# Patient Record
Sex: Female | Born: 1950 | Race: Black or African American | Hispanic: No | Marital: Single | Smoking: Former smoker
Health system: Southern US, Community
[De-identification: ages and names within clinical notes are randomized; demographics above are authoritative.]

## PROBLEM LIST (undated history)

## (undated) DIAGNOSIS — M069 Rheumatoid arthritis, unspecified: Secondary | ICD-10-CM

## (undated) DIAGNOSIS — H409 Unspecified glaucoma: Secondary | ICD-10-CM

## (undated) DIAGNOSIS — J45909 Unspecified asthma, uncomplicated: Secondary | ICD-10-CM

## (undated) DIAGNOSIS — I10 Essential (primary) hypertension: Secondary | ICD-10-CM

## (undated) DIAGNOSIS — F32A Depression, unspecified: Secondary | ICD-10-CM

## (undated) DIAGNOSIS — G35 Multiple sclerosis: Secondary | ICD-10-CM

## (undated) DIAGNOSIS — B1920 Unspecified viral hepatitis C without hepatic coma: Secondary | ICD-10-CM

## (undated) DIAGNOSIS — F329 Major depressive disorder, single episode, unspecified: Secondary | ICD-10-CM

## (undated) DIAGNOSIS — K219 Gastro-esophageal reflux disease without esophagitis: Secondary | ICD-10-CM

## (undated) HISTORY — PX: CHOLECYSTECTOMY: SHX55

## (undated) HISTORY — DX: Gastro-esophageal reflux disease without esophagitis: K21.9

## (undated) HISTORY — DX: Unspecified asthma, uncomplicated: J45.909

## (undated) HISTORY — DX: Rheumatoid arthritis, unspecified: M06.9

## (undated) HISTORY — DX: Unspecified glaucoma: H40.9

---

## 1998-09-21 ENCOUNTER — Emergency Department (HOSPITAL_COMMUNITY): Admission: EM | Admit: 1998-09-21 | Discharge: 1998-09-21 | Payer: Self-pay | Admitting: Emergency Medicine

## 1998-12-13 ENCOUNTER — Emergency Department (HOSPITAL_COMMUNITY): Admission: EM | Admit: 1998-12-13 | Discharge: 1998-12-13 | Payer: Self-pay | Admitting: Emergency Medicine

## 1999-01-19 ENCOUNTER — Emergency Department (HOSPITAL_COMMUNITY): Admission: EM | Admit: 1999-01-19 | Discharge: 1999-01-19 | Payer: Self-pay | Admitting: *Deleted

## 1999-03-13 ENCOUNTER — Emergency Department (HOSPITAL_COMMUNITY): Admission: EM | Admit: 1999-03-13 | Discharge: 1999-03-13 | Payer: Self-pay | Admitting: Emergency Medicine

## 1999-03-20 ENCOUNTER — Encounter: Admission: RE | Admit: 1999-03-20 | Discharge: 1999-03-20 | Payer: Self-pay | Admitting: Internal Medicine

## 1999-09-04 ENCOUNTER — Emergency Department (HOSPITAL_COMMUNITY): Admission: EM | Admit: 1999-09-04 | Discharge: 1999-09-04 | Payer: Self-pay | Admitting: Emergency Medicine

## 1999-09-29 ENCOUNTER — Emergency Department (HOSPITAL_COMMUNITY): Admission: EM | Admit: 1999-09-29 | Discharge: 1999-09-29 | Payer: Self-pay | Admitting: Emergency Medicine

## 1999-09-29 ENCOUNTER — Encounter: Payer: Self-pay | Admitting: Emergency Medicine

## 1999-10-22 ENCOUNTER — Emergency Department (HOSPITAL_COMMUNITY): Admission: EM | Admit: 1999-10-22 | Discharge: 1999-10-23 | Payer: Self-pay | Admitting: Internal Medicine

## 1999-10-23 ENCOUNTER — Encounter: Payer: Self-pay | Admitting: Emergency Medicine

## 1999-10-25 ENCOUNTER — Emergency Department (HOSPITAL_COMMUNITY): Admission: EM | Admit: 1999-10-25 | Discharge: 1999-10-25 | Payer: Self-pay | Admitting: Emergency Medicine

## 1999-11-14 ENCOUNTER — Encounter: Payer: Self-pay | Admitting: Emergency Medicine

## 1999-11-14 ENCOUNTER — Emergency Department (HOSPITAL_COMMUNITY): Admission: EM | Admit: 1999-11-14 | Discharge: 1999-11-14 | Payer: Self-pay | Admitting: Emergency Medicine

## 1999-11-17 ENCOUNTER — Emergency Department (HOSPITAL_COMMUNITY): Admission: EM | Admit: 1999-11-17 | Discharge: 1999-11-17 | Payer: Self-pay | Admitting: Emergency Medicine

## 1999-12-03 ENCOUNTER — Emergency Department (HOSPITAL_COMMUNITY): Admission: EM | Admit: 1999-12-03 | Discharge: 1999-12-03 | Payer: Self-pay | Admitting: Emergency Medicine

## 1999-12-10 ENCOUNTER — Emergency Department (HOSPITAL_COMMUNITY): Admission: EM | Admit: 1999-12-10 | Discharge: 1999-12-10 | Payer: Self-pay | Admitting: Emergency Medicine

## 1999-12-11 ENCOUNTER — Encounter: Payer: Self-pay | Admitting: Emergency Medicine

## 2000-04-08 ENCOUNTER — Emergency Department (HOSPITAL_COMMUNITY): Admission: EM | Admit: 2000-04-08 | Discharge: 2000-04-08 | Payer: Self-pay | Admitting: Emergency Medicine

## 2000-04-08 ENCOUNTER — Encounter: Payer: Self-pay | Admitting: Family Medicine

## 2002-02-06 ENCOUNTER — Emergency Department (HOSPITAL_COMMUNITY): Admission: EM | Admit: 2002-02-06 | Discharge: 2002-02-06 | Payer: Self-pay | Admitting: Emergency Medicine

## 2002-05-10 ENCOUNTER — Encounter: Payer: Self-pay | Admitting: Emergency Medicine

## 2002-05-10 ENCOUNTER — Emergency Department (HOSPITAL_COMMUNITY): Admission: EM | Admit: 2002-05-10 | Discharge: 2002-05-10 | Payer: Self-pay | Admitting: Emergency Medicine

## 2003-03-16 ENCOUNTER — Inpatient Hospital Stay (HOSPITAL_COMMUNITY): Admission: AD | Admit: 2003-03-16 | Discharge: 2003-03-17 | Payer: Self-pay | Admitting: Emergency Medicine

## 2003-03-16 ENCOUNTER — Encounter: Payer: Self-pay | Admitting: Cardiology

## 2003-03-17 ENCOUNTER — Encounter: Payer: Self-pay | Admitting: Cardiology

## 2005-08-22 ENCOUNTER — Emergency Department (HOSPITAL_COMMUNITY): Admission: EM | Admit: 2005-08-22 | Discharge: 2005-08-22 | Payer: Self-pay | Admitting: Family Medicine

## 2006-01-19 ENCOUNTER — Ambulatory Visit: Payer: Self-pay | Admitting: Internal Medicine

## 2006-01-20 ENCOUNTER — Ambulatory Visit: Payer: Self-pay | Admitting: Gastroenterology

## 2006-01-22 ENCOUNTER — Ambulatory Visit: Payer: Self-pay | Admitting: Internal Medicine

## 2006-03-13 ENCOUNTER — Emergency Department (HOSPITAL_COMMUNITY): Admission: EM | Admit: 2006-03-13 | Discharge: 2006-03-13 | Payer: Self-pay | Admitting: Emergency Medicine

## 2006-04-19 ENCOUNTER — Ambulatory Visit (HOSPITAL_COMMUNITY): Admission: RE | Admit: 2006-04-19 | Discharge: 2006-04-20 | Payer: Self-pay | Admitting: Surgery

## 2007-05-16 ENCOUNTER — Emergency Department (HOSPITAL_COMMUNITY): Admission: EM | Admit: 2007-05-16 | Discharge: 2007-05-16 | Payer: Self-pay | Admitting: Emergency Medicine

## 2007-06-08 ENCOUNTER — Emergency Department (HOSPITAL_COMMUNITY): Admission: EM | Admit: 2007-06-08 | Discharge: 2007-06-08 | Payer: Self-pay | Admitting: Emergency Medicine

## 2007-07-01 ENCOUNTER — Ambulatory Visit: Payer: Self-pay | Admitting: Internal Medicine

## 2007-10-19 ENCOUNTER — Emergency Department (HOSPITAL_COMMUNITY): Admission: EM | Admit: 2007-10-19 | Discharge: 2007-10-19 | Payer: Self-pay | Admitting: Emergency Medicine

## 2009-05-22 ENCOUNTER — Ambulatory Visit: Payer: Self-pay | Admitting: Internal Medicine

## 2009-05-28 ENCOUNTER — Ambulatory Visit: Payer: Self-pay | Admitting: Internal Medicine

## 2009-05-28 LAB — CONVERTED CEMR LAB: HCV Genotype: 2

## 2009-05-30 ENCOUNTER — Ambulatory Visit: Payer: Self-pay | Admitting: Internal Medicine

## 2009-06-13 ENCOUNTER — Ambulatory Visit: Payer: Self-pay | Admitting: Internal Medicine

## 2009-07-01 ENCOUNTER — Ambulatory Visit: Payer: Self-pay | Admitting: Gastroenterology

## 2009-07-01 DIAGNOSIS — B182 Chronic viral hepatitis C: Secondary | ICD-10-CM | POA: Insufficient documentation

## 2009-07-01 DIAGNOSIS — R1310 Dysphagia, unspecified: Secondary | ICD-10-CM | POA: Insufficient documentation

## 2009-07-01 DIAGNOSIS — G35 Multiple sclerosis: Secondary | ICD-10-CM | POA: Insufficient documentation

## 2009-07-01 DIAGNOSIS — K219 Gastro-esophageal reflux disease without esophagitis: Secondary | ICD-10-CM

## 2009-07-01 LAB — CONVERTED CEMR LAB
ALT: 66 units/L — ABNORMAL HIGH (ref 0–35)
Albumin: 3.5 g/dL (ref 3.5–5.2)
Total Protein: 8.6 g/dL — ABNORMAL HIGH (ref 6.0–8.3)
aPTT: 27.2 s (ref 21.7–28.8)

## 2009-07-02 ENCOUNTER — Ambulatory Visit: Payer: Self-pay | Admitting: Gastroenterology

## 2009-07-03 ENCOUNTER — Ambulatory Visit (HOSPITAL_COMMUNITY): Admission: RE | Admit: 2009-07-03 | Discharge: 2009-07-03 | Payer: Self-pay | Admitting: Gastroenterology

## 2009-07-08 ENCOUNTER — Telehealth: Payer: Self-pay | Admitting: Gastroenterology

## 2009-07-09 ENCOUNTER — Telehealth: Payer: Self-pay | Admitting: Gastroenterology

## 2009-07-09 ENCOUNTER — Ambulatory Visit: Payer: Self-pay | Admitting: Gastroenterology

## 2009-07-11 ENCOUNTER — Ambulatory Visit: Payer: Self-pay | Admitting: Gastroenterology

## 2009-07-11 ENCOUNTER — Encounter: Payer: Self-pay | Admitting: Gastroenterology

## 2009-07-15 ENCOUNTER — Telehealth: Payer: Self-pay | Admitting: Gastroenterology

## 2009-08-06 ENCOUNTER — Encounter: Payer: Self-pay | Admitting: Gastroenterology

## 2009-08-29 ENCOUNTER — Ambulatory Visit: Payer: Self-pay | Admitting: Gastroenterology

## 2009-09-26 ENCOUNTER — Ambulatory Visit: Payer: Self-pay | Admitting: Gastroenterology

## 2009-09-26 ENCOUNTER — Encounter: Payer: Self-pay | Admitting: Gastroenterology

## 2009-10-11 ENCOUNTER — Ambulatory Visit: Payer: Self-pay | Admitting: Internal Medicine

## 2009-10-11 ENCOUNTER — Inpatient Hospital Stay (HOSPITAL_COMMUNITY): Admission: EM | Admit: 2009-10-11 | Discharge: 2009-10-14 | Payer: Self-pay | Admitting: Emergency Medicine

## 2009-10-11 ENCOUNTER — Encounter: Payer: Self-pay | Admitting: Internal Medicine

## 2009-10-17 ENCOUNTER — Ambulatory Visit: Payer: Self-pay | Admitting: Gastroenterology

## 2009-12-16 ENCOUNTER — Ambulatory Visit: Payer: Self-pay | Admitting: Internal Medicine

## 2009-12-16 ENCOUNTER — Encounter (INDEPENDENT_AMBULATORY_CARE_PROVIDER_SITE_OTHER): Payer: Self-pay | Admitting: Adult Health

## 2009-12-16 LAB — CONVERTED CEMR LAB
BUN: 12 mg/dL (ref 6–23)
CO2: 26 meq/L (ref 19–32)
Creatinine, Ser: 0.92 mg/dL (ref 0.40–1.20)
HDL: 55 mg/dL (ref >39)
LDL Cholesterol: 135 mg/dL — ABNORMAL HIGH (ref 0–99)
Potassium: 4.2 meq/L (ref 3.5–5.3)
Sodium: 138 meq/L (ref 135–145)
Triglycerides: 99 mg/dL (ref <150)

## 2009-12-28 ENCOUNTER — Emergency Department (HOSPITAL_COMMUNITY): Admission: EM | Admit: 2009-12-28 | Discharge: 2009-12-28 | Payer: Self-pay | Admitting: Family Medicine

## 2010-01-07 ENCOUNTER — Emergency Department (HOSPITAL_COMMUNITY): Admission: EM | Admit: 2010-01-07 | Discharge: 2010-01-07 | Payer: Self-pay | Admitting: Emergency Medicine

## 2010-03-06 ENCOUNTER — Emergency Department (HOSPITAL_COMMUNITY)
Admission: EM | Admit: 2010-03-06 | Discharge: 2010-03-06 | Payer: Self-pay | Source: Home / Self Care | Admitting: Emergency Medicine

## 2010-04-25 ENCOUNTER — Encounter: Admission: RE | Admit: 2010-04-25 | Discharge: 2010-04-25 | Payer: Self-pay | Admitting: Orthopaedic Surgery

## 2010-06-14 ENCOUNTER — Emergency Department (HOSPITAL_COMMUNITY)
Admission: EM | Admit: 2010-06-14 | Discharge: 2010-06-14 | Payer: Self-pay | Source: Home / Self Care | Admitting: Emergency Medicine

## 2010-06-14 ENCOUNTER — Encounter: Payer: Self-pay | Admitting: Orthopaedic Surgery

## 2010-06-15 ENCOUNTER — Encounter: Payer: Self-pay | Admitting: Gastroenterology

## 2010-06-17 LAB — COMPREHENSIVE METABOLIC PANEL
AST: 58 U/L — ABNORMAL HIGH (ref 0–37)
Albumin: 3.3 g/dL — ABNORMAL LOW (ref 3.5–5.2)
Alkaline Phosphatase: 80 U/L (ref 39–117)
BUN: 10 mg/dL (ref 6–23)
Creatinine, Ser: 0.84 mg/dL (ref 0.4–1.2)
GFR calc Af Amer: >60 mL/min (ref >60)
Sodium: 140 mEq/L (ref 135–145)
Total Protein: 8.7 g/dL — ABNORMAL HIGH (ref 6.0–8.3)

## 2010-06-17 LAB — DIFFERENTIAL
Basophils Relative: 0 % (ref 0–1)
Eosinophils Relative: 0 % (ref 0–5)
Monocytes Absolute: 0.6 10*3/uL (ref 0.1–1.0)
Monocytes Relative: 6 % (ref 3–12)
Neutro Abs: 8.8 10*3/uL — ABNORMAL HIGH (ref 1.7–7.7)
Neutrophils Relative %: 81 % — ABNORMAL HIGH (ref 43–77)

## 2010-06-17 LAB — URINE MICROSCOPIC-ADD ON

## 2010-06-17 LAB — URINALYSIS, ROUTINE W REFLEX MICROSCOPIC
Hgb urine dipstick: NEGATIVE
Nitrite: NEGATIVE
Protein, ur: NEGATIVE mg/dL
Urine Glucose, Fasting: NEGATIVE mg/dL
Urobilinogen, UA: 0.2 mg/dL (ref 0.0–1.0)
pH: 6 (ref 5.0–8.0)

## 2010-06-17 LAB — CBC
HCT: 39.9 % (ref 36.0–46.0)
Hemoglobin: 13.2 g/dL (ref 12.0–15.0)
MCHC: 33.1 g/dL (ref 30.0–36.0)
RBC: 4.66 MIL/uL (ref 3.87–5.11)
WBC: 10.9 10*3/uL — ABNORMAL HIGH (ref 4.0–10.5)

## 2010-06-24 NOTE — Letter (Signed)
Summary: Results Letter  [facility]  [address] [place], [place] [address]   Phone: [phone]  Fax: [phone]        July 01, 2009 MRN: [id number]    [facility] [address] [place], [place]  [address]    Dear Ms. MCCRAY,  It is my pleasure to have treated you recently as a new patient in my office. I appreciate your confidence and the opportunity to participate in your care.  Since I do have a busy inpatient endoscopy schedule and office schedule, my office hours vary weekly. I am, however, available for emergency calls everyday through my office. If I am not available for an urgent office appointment, another one of our gastroenterologist will be able to assist you.  My well-trained staff are prepared to help you at all times. For emergencies after office hours, a physician from our Gastroenterology section is always available through my 24 hour answering service  Once again I welcome you as a new patient and I look forward to a happy and healthy relationship             Sincerely,  Louis Meckel MD  This letter has been electronically signed by your physician.

## 2010-06-24 NOTE — Procedures (Signed)
Summary: Upper Endoscopy w/DIL  Patient: Deborah Reid Note: All result statuses are Final unless otherwise noted.  Tests: (1) Upper Endoscopy w/DIL (UED)  UED Upper Endoscopy w/DIL                             DONE     [facility]     [address]     [place], [place]  [address]           ENDOSCOPY PROCEDURE REPORT           PATIENT:  Deborah Reid, Deborah Reid  MR#:  [id number]     BIRTHDATE:  [birth year], 59 yrs. old  GENDER:  female           ENDOSCOPIST:  Barbette Hair. Arlyce Dice, MD     Referred by:           PROCEDURE DATE:  07/02/2009     PROCEDURE:  EGD, diagnostic, Maloney Dilation of Esophagus     ASA CLASS:  Class II     INDICATIONS:  dysphagia           MEDICATIONS:   Fentanyl 50 mcg IV, Versed 5 mg IV, glycopyrrolate     (Robinal) 0.2 mg IV, 0.6cc simethancone 0.6 cc PO     TOPICAL ANESTHETIC:  Exactacain Spray           DESCRIPTION OF PROCEDURE:   After the risks benefits and     alternatives of the procedure were thoroughly explained, informed     consent was obtained.  The LB GIF-H180 [id number] endoscope was     introduced through the mouth and advanced to the third portion of     the duodenum, without limitations.  The instrument was slowly     withdrawn as the mucosa was fully examined.     <<PROCEDUREIMAGES>>           A stricture was found at the gastroesophageal junction (see     image1). Early esophageal stricture Dilation with maloney dilator     18mm Moderate resistance; no heme  Otherwise the examination was     normal.    Retroflexed views revealed no abnormalities.    The     scope was then withdrawn from the patient and the procedure     completed.           COMPLICATIONS:  None           ENDOSCOPIC IMPRESSION:     1) Stricture at the gastroesophageal junction - s/p dilitation     2) Otherwise normal examination     RECOMMENDATIONS:     1) continue PPI     2) Call office next 2-3 days to schedule an office appointment     for 4-6 weeks     3)  dilatations PRN           REPEAT EXAM:  No           ______________________________     Barbette Hair. Arlyce Dice, MD           cc Dr. Donia Guiles           n.     eSIGNED:   Barbette Hair. Kaplan at 07/02/2009 04:41 PM           Cheri Kearns, [phone]  Note: An exclamation mark (!) indicates a result that was not dispersed into the flowsheet. Document Creation Date: 07/03/2009 10:18 AM  _______________________________________________________________________  (1) Order result status: Final Collection or observation date-time: 07/02/2009 16:37 Requested date-time:  Receipt date-time:  Reported date-time:  Referring Physician:   Ordering Physician: Melvia Heaps [phone]) Specimen Source:  Source: Launa Grill Order Number: [id number] Lab site:

## 2010-06-24 NOTE — Progress Notes (Signed)
Summary: TRIAGE  Phone Note From Other Clinic Call back at [phone]   Caller: Admitting Lib Call For: Arlyce Dice Summary of Call: Patient is preped for colon and she is not on there list,Lib checked with [facility] and she is not on there list either wants to speak to a nurse asap Initial call taken by: Tawni Levy,  July 08, 2009 12:46 PM  Follow-up for Phone Call        Pt. is scheduled for a Colonoscopy in [facility] on 07-09-09. Her instructions given to her on 07-01-09 have the correct date/time, she must have read them incorrectly. I spoke pt, she will  stay on clear liquids and come for Colonoscopy tomorrow as scheduled. Pt. instructed to call back as needed.  Follow-up by: Laureen Ochs LPN,  July 08, 2009 1:32 PM

## 2010-06-24 NOTE — Miscellaneous (Signed)
Summary: Hospital Admission  INTERNAL MEDICINE ADMISSION HISTORY AND PHYSICAL PCP: Dr. Reche Dixon ([facility]) CC: "paralyzed"  HPI: 60 y/o AA woman presents with a chief complaint of AMS and generalized weakness with a sudden onset on the morning of the admission. History is collected from the daughter. The patient was last seen normal at 10:23 am. She went to take a shower and called the daughter upstairs. When she got there, patient was not talking, was moving slowly and had slow and unsteady gate. Daguhter helped her to the room where the patient "fainted" on her bed for several minutes. She ran to drop off her kids at her neighbors. When she returned to her mother -the patient was unverbal, did not follow the commands. Daughter states that the patient's eye were rolling, her lips were twitching. the aptient was taken to the ED. Per daughter, the patient did not c/o HA, chest pain, SOB. she denies any similar Sx in the past. she denies any use of illicit drugs or ETOH use. patient has a Hx of MS diagnosed 3 years ago and currently on "one shot a week" prescribed by Dr. Benson Norway from [facility]. the daughter also states the patient had one episode of vision loss 2 years ago.  ALLERGIES: NKDA   PAST MEDICAL HISTORY:  Arthritis Asthma Depression Hepatitis C (07/2009) MS (2009) --seen by Dr. Benson Norway at [facility]. Cholelithiasis Esophageal stricture GERD   MEDICATIONS: SINGULAIR 10 MG TABS (MONTELUKAST SODIUM) as needed NORTRIPTYLINE HCL 10 MG CAPS (NORTRIPTYLINE HCL) once daily IBUPROFEN 800 MG TABS (IBUPROFEN) as needed OMEPRAZOLE 20 MG CPDR (OMEPRAZOLE) takes one tab daily   SOCIAL HISTORY: Occupation: Unemployed Patient is a former smoker. Has 3 grown daughters. Alcohol Use - no Daily Caffeine Use Illicit Drug Use - no   FAMILY HISTORY Family History of Diabetes: GF Family History of Heart Disease: GM Family History of Colon Cancer:Brother   ROS:per HPI VITALS: T:98.3  P: 102>78  BP: 155/78 R:17  O2SAT: 100% ON:RA PHYSICAL EXAM: General:  alert, well-developed, and cooperative to examination.   Head:  normocephalic and atraumatic.   Eyes:  vision grossly intact, pupils equal, pupils round, pupils reactive to light, no injection and anicteric.   Mouth:  pharynx pink and moist, no erythema, and no exudates.   Neck:  supple, full ROM, no thyromegaly, no JVD, and no carotid bruits.   Lungs:  normal respiratory effort, no accessory muscle use, normal breath sounds, no crackles, and no wheezes.  CV: RRR, no M, S3, S4, no lifts or rubs. Abdomen: ND; BS+, NTTP, no HSM MSK: FROM of all extremities proximately and distally bialterally; no joint erythema, effusion or increased warmth to touch bilaterally.  Neurologic:  alert & oriented X3, cranial nerves II-XII intact, strength normal in all extremities, sensation intact to light touch, and gait normal.   Skin:  turgor normal and no rashes.   Psych:  Oriented X3, memory intact for recent and remote, normally interactive, good eye contact, not anxious appearing, and not depressed appearing.  LABS: Color, Urine                             YELLOW            YELLOW  Appearance                               CLEAR  CLEAR  Specific Gravity                         1.025             1.005-1.030  pH                                       6.0               5.0-8.0  Urine Glucose                            NEGATIVE          NEG              mg/dL  Bilirubin                                NEGATIVE          NEG  Ketones                                  15         a      NEG              mg/dL  Blood                                    NEGATIVE          NEG  Protein                                  NEGATIVE          NEG              mg/dL  Urobilinogen                             1.0               0.0-1.0          mg/dL  Nitrite                                  NEGATIVE          NEG  Leukocytes                                NEGATIVE          NEG  CKMB, POC                                1.4               1.0-8.0          ng/mL  Troponin I, POC                          <  0.05             0.00-0.09        ng/mL  Myoglobin, POC                           110               12-200           ng/mL Bilirubin, Total                         0.7               0.3-1.2          mg/dL  Bilirubin, Direct                        0.2               0.0-0.3          mg/dL  Indirect Bilirubin                       0.5               0.3-0.9          mg/dL  Alkaline Phosphatase                     81                39-117           U/L  SGOT (AST)                               55         h      0-37             U/L  SGPT (ALT)                               50         h      0-35             U/L  Total  Protein                           7.8               6.0-8.3          g/dL  Albumin-Blood                            3.3        l      3.5-5.2          g/dL  Sodium (NA)                              137               135-145          mEq/L  Potassium (K)  3.3        l      3.5-5.1          mEq/L  Chloride                                 107               96-112           mEq/L  CO2                                      23                19-32            mEq/L  Glucose                                  108        h      70-99            mg/dL  BUN                                      9                 6-23             mg/dL  Creatinine                               1.00              0.4-1.2          mg/dL  GFR, Est Non African American            57         l      >60              mL/min  GFR, Est African American                >60               >60              mL/min    Oversized comment, see footnote  1  Calcium                                  8.4               8.4-10.5         mg/dL  WBC                                      2.4        l      4.0-10.5         K/uL  RBC  3.99              3.87-5.11        MIL/uL  Hemoglobin (HGB)                         11.5       l      12.0-15.0        g/dL  Hematocrit (HCT)                         34.6       l      36.0-46.0        %  MCV                                      86.7              78.0-100.0       fL  MCHC                                     33.3              30.0-36.0        g/dL  RDW                                      15.3              11.5-15.5        %  Platelet Count (PLT)                     127        l      150-400          K/uL  Neutrophils, %                           55                43-77            %  Lymphocytes, %                           29                12-46            %  Monocytes, %                             16         h      3-12             %  Eosinophils, %                           0                 0-5              %  Basophils, %  1                 0-1              %  Neutrophils, Absolute                    1.3        l      1.7-7.7          K/uL  Lymphocytes, Absolute                    0.7               0.7-4.0          K/uL  Monocytes, Absolute                      0.4               0.1-1.0          K/uL  Eosinophils, Absolute                    0.0               0.0-0.7          K/uL  Basophils, Absolute                      0.0               0.0-0.1          K/uL    PTT(a-Partial Thromboplastn Time)        27                24-37          Protime ( Prothrombin Time)              13.3              11.6-15.2        seconds  INR                                      1.02              0.00-1.49   ASSESSMENT AND PLAN: (1)General muscle weakness. Unclear etiology. Working diagnoses are:   A. Per Dr. Thad Ranger (Neuro) consult -->likely psychogenic etiology. However, given Hx of MS --will order MRI of brain with contrast; and will request medical records from Dr. Benson Norway at [facility].  B. Seizure disorder is also probable given the HPI per the  patient's daughter. Will order prolactin level. C. Although postassium is slightly decreased; there is no other obvious electrolytes abnormality.  Endocrine disease such as hypothyroidism, Addison's disease or hyperaldosteronism are also need to be considered. Will order TSH. D. CVA and Catatonic syndrome due to the CNS casuses (such as viral encephalitis, intracranial hemorrhage) are not likely given normal CT of head and no focal deficits on neuro exam. TIA is also to be considered. However, further imaging with an MRI/MRA might help to differentiate this further. I. Infectious etiology. Will also check for HIV, RPR. Meningitis is low on the list of Dx --given, no meningismus, normal WBC count and lack of fever. J. Anemia as an etiology is unlikely given normal Hgb; Will keep NPO and request speech and swallow  eval first. Seizure watch. Will check Vit B12, Folate levels. (2) Elevated BP. No known established Dx of HTN. Will continue to monitor. (3) Hx of asthma. No wheezing, cough or dypnea. No treatment for now. (4)DEPRESSION/ANXIETY: Will hold Elavil and Prozac until cleared by peech and swallow evaluation. (5)VTE PROPH:Heparin.

## 2010-06-24 NOTE — Letter (Signed)
Summary: [facility]  [facility]   Imported By: Lester Door 10/23/2009 10:10:26  _____________________________________________________________________  External Attachment:    Type:   Image     Comment:   External Document

## 2010-06-24 NOTE — Letter (Signed)
Summary: Pretreatment Hep C Class/[facility]  Pretreatment Hep C Class/[facility]   Imported By: Sherian Rein 08/16/2009 14:50:21  _____________________________________________________________________  External Attachment:    Type:   Image     Comment:   External Document

## 2010-06-24 NOTE — Progress Notes (Signed)
Summary: Triage-Cancelled Colonoscopy  Phone Note Call from Patient Call back at Home Phone [phone]   Caller: Patient Call For: Dr. Arlyce Dice Summary of Call: Pt. wants to talk with you about her procedure this afternoon Initial call taken by: Karna Christmas,  July 15, 2009 10:14 AM  Follow-up for Phone Call        Message left for patient to callback. Laureen Ochs LPN  July 15, 2009 10:23 AM   See triage from 07-09-09. I spoke w/pt. at that time and rescheduled her without charging her, she assured me she would keep her appt. today. Now she states her ride has cancelled on her. I advised pt. she would be charged the cancellation fee of $100 and per Dr.Kaplan we will not reschedule her until she pays the cx. fee.  Follow-up by: Laureen Ochs LPN,  July 15, 2009 2:27 PM

## 2010-06-24 NOTE — Letter (Signed)
Summary: [facility] Instructions  [facility]  [address] [place], [place] [address]   Phone: [phone]  Fax: [phone]       LINET BRASH    02/10/1951    MRN: [id number]       Procedure Day /Date:TUESDAY 07/09/2009     Arrival Time: 12:30PM     Procedure Time:1:30PM     Location of Procedure:                    X   [facility] (4th Floor)    PREPARATION FOR COLONOSCOPY WITH MIRALAX  Starting 5 days prior to your procedure 07/04/2009 do not eat nuts, seeds, popcorn, corn, beans, peas,  salads, or any raw vegetables.  Do not take any fiber supplements (e.g. Metamucil, Citrucel, and Benefiber). ____________________________________________________________________________________________________   THE DAY BEFORE YOUR PROCEDURE         DATE: 07/08/2009 DAY: MONDAY  1   Drink clear liquids the entire day-NO SOLID FOOD  2   Do not drink anything colored red or purple.  Avoid juices with pulp.  No orange juice.  3   Drink at least 64 oz. (8 glasses) of fluid/clear liquids during the day to prevent dehydration and help the prep work efficiently.  CLEAR LIQUIDS INCLUDE: Water Jello Ice Popsicles Tea (sugar ok, no milk/cream) Powdered fruit flavored drinks Coffee (sugar ok, no milk/cream) Gatorade Juice: apple, white grape, white cranberry  Lemonade Clear bullion, consomm, broth Carbonated beverages (any kind) Strained chicken noodle soup Hard Candy  4   Mix the entire bottle of Miralax with 64 oz. of Gatorade/Powerade in the morning and put in the refrigerator to chill.  5   At 3:00 pm take 2 Dulcolax/Bisacodyl tablets.  6   At 4:30 pm take one Reglan/Metoclopramide tablet.  7  Starting at 5:00 pm drink one 8 oz glass of the Miralax mixture every 15-20 minutes until you have finished drinking the entire 64 oz.  You should finish drinking prep around 7:30 or 8:00 pm.  8   If you are nauseated, you may take the 2nd Reglan/Metoclopramide  tablet at 6:30 pm.        9    At 8:00 pm take 2 more DULCOLAX/Bisacodyl tablets.     THE DAY OF YOUR PROCEDURE      DATE: 07/09/2009 DAY: Jake Shark  You may drink clear liquids until 11:30AM (2 HOURS BEFORE PROCEDURE).   MEDICATION INSTRUCTIONS  Unless otherwise instructed, you should take regular prescription medications with a small sip of water as early as possible the morning of your procedure.          OTHER INSTRUCTIONS  You will need a responsible adult at least 60 years of age to accompany you and drive you home.   This person must remain in the waiting room during your procedure.  Wear loose fitting clothing that is easily removed.  Leave jewelry and other valuables at home.  However, you may wish to bring a book to read or an iPod/MP3 player to listen to music as you wait for your procedure to start.  Remove all body piercing jewelry and leave at home.  Total time from sign-in until discharge is approximately 2-3 hours.  You should go home directly after your procedure and rest.  You can resume normal activities the day after your procedure.  The day of your procedure you should not:   Drive   Make legal decisions   Operate machinery  Drink alcohol   Return to work  You will receive specific instructions about eating, activities and medications before you leave.   The above instructions have been reviewed and explained to me by   _______________________    I fully understand and can verbalize these instructions _____________________________ Date _______

## 2010-06-24 NOTE — Assessment & Plan Note (Signed)
Summary: ABNORMAL LIVER FUNCTIONS/YF   History of Present Illness Visit Type: Initial Consult Primary GI MD: Melvia Heaps MD [facility] Primary Provider: Donia Guiles, MD Requesting Provider: Donia Guiles, MD Chief Complaint: abnormal liver function tests History of Present Illness:   [name] is a 60 year old American female referred at the request of Dr. Mathis Fare for evaluation of abnormal liver tests.  These were noted on routine testing.  Patient is hepatitis C antibody positive and has been a HCV quantitative  RNA log of 6.94.  Hepatitis B surface antigen is negative.  She is hepatitis C viral genotype 2.  There is no history of hepatitis, IV drug use or blood transfusions.  She complains of a pruritus and light-colored stools.  The patient was recently diagnosed with multiple sclerosis.  She also complains of reflux with dysphagia to liquids and solids, nausea and loss of appetite.  Bowels are erratic with alternating episodes of diarrhea and constipation.  She denies melena.   GI Review of Systems    Reports acid reflux, bloating, dysphagia with liquids, dysphagia with solids, loss of appetite, nausea, vomiting, and  weight gain.      Denies abdominal pain, belching, chest pain, heartburn, vomiting blood, and  weight loss.      Reports change in bowel habits, constipation, and  diarrhea.     Denies anal fissure, black tarry stools, diverticulosis, fecal incontinence, heme positive stool, hemorrhoids, irritable bowel syndrome, jaundice, light color stool, liver problems, rectal bleeding, and  rectal pain. Preventive Screening-Counseling & Management  Alcohol-Tobacco     Smoking Status: quit      Drug Use:  no.      Current Medications (verified): 1)  Singulair 10 Mg Tabs (Montelukast Sodium) .... As Needed 2)  Nortriptyline Hcl 10 Mg Caps (Nortriptyline Hcl) .... Once Daily 3)  Ibuprofen 800 Mg Tabs (Ibuprofen) .... As Needed 4)  Amoxicillin 500 Mg Caps (Amoxicillin) .... Once  Daily  Allergies (verified): No Known Drug Allergies  Past History:  Past Medical History: Arthritis Asthma Depression Hepatitis C  Past Surgical History: Cholecystectomy 2007  Family History: Family History of Diabetes: GF Family History of Heart Disease: GM Family History of Colon Cancer:Brother  Social History: Occupation: Unemployed Patient is a former smoker.  Alcohol Use - no Daily Caffeine Use Illicit Drug Use - no Smoking Status:  quit Drug Use:  no  Review of Systems       The patient complains of arthritis/joint pain, back pain, change in vision, depression-new, headaches-new, hearing problems, heart rhythm changes, itching, shortness of breath, sleeping problems, sore throat, swelling of feet/legs, swollen lymph glands, urine leakage, and vision changes.         All other systems were reviewed and were negative   Vital Signs:  Patient profile:   60 year old female Height:      62 inches Weight:      192.38 pounds BMI:     35.31 Pulse rate:   80 / minute Pulse rhythm:   regular BP sitting:   118 / 62  (left arm) Cuff size:   regular  Vitals Entered By: June McMurray CMA Duncan Dull) (July 01, 2009 9:49 AM)  Physical Exam  Additional Exam:  She is a slightly heavy female  There is no stigmata liver disease skin: anicteric HEENT: normocephalic; PEERLA; no nasal or pharyngeal abnormalities neck: supple nodes: no cervical lymphadenopathy chest: clear to ausculatation and percussion heart: no murmurs, gallops, or rubs abd: soft, nontender; BS normoactive; no  abdominal masses, tenderness, organomegaly rectal: deferred ext: no cynanosis, clubbing, edema skeletal: no deformities neuro: oriented x 3; no focal abnormalities    Impression & Recommendations:  Problem # 1:  HEPATITIS C, CHRONIC (ICD-070.54)  The patient has chronic hepatitis C as evidenced by active viral replication  Recommendations #1 patient will be referred to hepatitis C  clinic for consideration of therapy #2 abdominal ultrasound  Orders: TLB-Hepatic/Liver Function Pnl (80076-HEPATIC) TLB-PTT (85730-PTTL) EGD (EGD)  Problem # 2:  ESOPHAGEAL REFLUX (ICD-530.81)  Plan to begin omeprazole 20 mg daily  Orders: EGD (EGD)  Problem # 3:  SPECIAL SCREENING FOR MALIGNANT NEOPLASMS COLON (ICD-V76.51) Plan screening colonoscopy  Problem # 4:  DYSPHAGIA UNSPECIFIED (ICD-787.20)  rule out early esophageal stricture  Recommendations #1 upper endoscopy with dilatation as indicated  Risks, complications and alternatives to the procedure were explained to the patient, including bleeding, perforation, and the possible need for surgery.  Patients questions were answered.  Orders: EGD (EGD)  Problem # 5:  MULTIPLE SCLEROSIS (ICD-340) Assessment: Comment Only  Other Orders: Ultrasound Abdomen (UAS) Colonoscopy (Colon)  Patient Instructions: 1)  Colonoscopy and Flexible Sigmoidoscopy brochure given.  2)  Conscious Sedation brochure given.  3)  Upper Endoscopy with Dilatation brochure given.  4)  Your EGD is scheduled for 07/02/2009 5)  Your Colonoscopy is scheduled for 07/09/2009 6)  Your abdominal U/S is scheduled for 07/03/2009 at [facility] 7)  You can pick up your Miralax prep from your pahrmacy today 8)  We will contact you about a referral to the Hep C clinics 9)  You will go to the basement for labs today 10)  CC Dr. Donia Guiles 11)  The medication list was reviewed and reconciled.  All changed / newly prescribed medications were explained.  A complete medication list was provided to the patient / caregiver. Prescriptions: DULCOLAX 5 MG  TBEC (BISACODYL) Day before procedure take 2 at 3pm and 2 at 8pm.  #4 x 0   Entered by:   Merri Ray CMA (AAMA)   Authorized by:   Louis Meckel MD   Signed by:   Merri Ray CMA (AAMA) on 07/01/2009   Method used:   Electronically to        [facility] [phone]* (retail)       [address]       [place], [place]  [address]       Ph: [phone]       Fax: [phone]   RxID:   [id number] REGLAN 10 MG  TABS (METOCLOPRAMIDE HCL) As per prep instructions.  #2 x 0   Entered by:   Merri Ray CMA (AAMA)   Authorized by:   Louis Meckel MD   Signed by:   Merri Ray CMA (AAMA) on 07/01/2009   Method used:   Electronically to        [facility] [phone]* (retail)       [address]       [place], [place]  [address]       Ph: [phone]       Fax: [phone]   RxID:   [id number] MIRALAX   POWD (POLYETHYLENE GLYCOL 3350) As per prep  instructions.  #255gm x 0   Entered by:   Merri Ray CMA (AAMA)   Authorized by:   Louis Meckel MD   Signed by:   Merri Ray CMA (AAMA) on 07/01/2009   Method used:   Electronically to  [facility] [address] [phone]* (retail)       [address]       [place], [place]  [address]       Ph: [phone]       Fax: [phone]   RxID:   [id number] OMEPRAZOLE 20 MG CPDR (OMEPRAZOLE) takes one tab daily  #30 x 2   Entered and Authorized by:   Louis Meckel MD   Signed by:   Louis Meckel MD on 07/01/2009   Method used:   Electronically to        [facility] [phone]* (retail)       [address]       [place], [place]  [address]       Ph: [phone]       Fax: [phone]   RxID:   [id number]   Appended Document: Orders Update    Clinical Lists Changes  Orders: Added new Test order of [facility] Odie Sera) - Signed

## 2010-06-24 NOTE — Letter (Signed)
Summary: Hepatology/[facility]  Hepatology/[facility]   Imported By: Sherian Rein 08/16/2009 14:51:52  _____________________________________________________________________  External Attachment:    Type:   Image     Comment:   External Document

## 2010-06-24 NOTE — Progress Notes (Signed)
Summary: Cancelled procedure for today  Phone Note Call from Patient   Call For: DR [facility] Summary of Call: Called to cancel her procedure for this afternoon because she did not have a transportation. I adviced her of the $100 cancell fee and she just said she did not have $100. Did not want to reschedule.  Initial call taken by: Leanor Kail [facility]  July 09, 2009 10:09 AM  Follow-up for Phone Call        ok.  do not reschedule unless she pays cancellation fee Follow-up by: Louis Meckel MD,  July 09, 2009 10:19 AM     Appended Document: Refills Spoke with pt , she rescheduled to 07-15-09. No cancellation fee charged, per me. Pt. assures me she will keep appt. on 07-15-09. All prep instructions updated with pt. by phone and new scripts sent to her pharmacy. Pt. instructed to call back as needed.    Clinical Lists Changes  Medications: Rx of MIRALAX   POWD (POLYETHYLENE GLYCOL 3350) As per prep  instructions.;  #255gm x 0;  Signed;  Entered by: Laureen Ochs LPN;  Authorized by: Louis Meckel MD;  Method used: Electronically to [facility] [phone]*, [address], [place], [place]  [address], Ph: [phone], Fax: [phone] Rx of REGLAN 10 MG  TABS (METOCLOPRAMIDE HCL) As per prep instructions.;  #2 x 0;  Signed;  Entered by: Laureen Ochs LPN;  Authorized by: Louis Meckel MD;  Method used: Electronically to [facility] [phone]*, [address], [place], [place]  [address], Ph: [phone], Fax: [phone] Rx of DULCOLAX 5 MG  TBEC (BISACODYL) Day before procedure take 2 at 3pm and 2 at 8pm.;  #4 x 0;  Signed;  Entered by: Laureen Ochs LPN;  Authorized by: Louis Meckel MD;  Method used: Electronically to [facility] [phone]*, [address], [place], [place]  [address], Ph: [phone], Fax: [phone]    Prescriptions: DULCOLAX 5 MG  TBEC (BISACODYL) Day before procedure take 2 at 3pm and 2 at 8pm.  #4 x 0   Entered by:   Laureen Ochs LPN  Authorized by:   Louis Meckel MD   Signed by:   Laureen Ochs LPN on 23/55/7322   Method used:   Electronically to        [facility] [phone]* (retail)       [address]       [place], [place]  [address]       Ph: [phone]       Fax: [phone]   RxID:   [id number] REGLAN 10 MG  TABS (METOCLOPRAMIDE HCL) As per prep instructions.  #2 x 0   Entered by:   Laureen Ochs LPN   Authorized by:   Louis Meckel MD   Signed by:   Laureen Ochs LPN on 38/18/2993   Method used:   Electronically to        [facility] [phone]* (retail)       [address]       [place], [place]  [address]       Ph: [phone]       Fax: [phone]   RxID:   [id number] MIRALAX   POWD (POLYETHYLENE GLYCOL 3350) As per prep  instructions.  #255gm x 0   Entered by:   Laureen Ochs LPN   Authorized by:   Louis Meckel MD   Signed by:   Laureen Ochs LPN on 19/50/9326  Method used:   Electronically to        [facility] [phone]* (retail)       [address]       [place], [place]  [address]       Ph: [phone]       Fax: [phone]   RxID:   [id number]

## 2010-06-24 NOTE — Consult Note (Signed)
[facility]: [facility]   Imported By: [place] 08/06/2009 09:59:15  _____________________________________________________________________  External Attachment:    Type:   Image     Comment:   External Document

## 2010-06-24 NOTE — Letter (Signed)
Summary: EGD Instructions  [facility]  [address] [place], [place] [address]   Phone: [phone]  Fax: [phone]       Deborah Reid    1950/06/14    MRN: [id number]       Procedure Day /Date:TUESDAY 07/02/2009     Arrival Time: 3PM     Procedure Time:4PM     Location of Procedure:                    X  [facility] (4th Floor)    PREPARATION FOR ENDOSCOPY/DIL   On 07/02/2009 THE DAY OF THE PROCEDURE:  1.   No solid foods, milk or milk products are allowed after midnight the night before your procedure.  2.   Do not drink anything colored red or purple.  Avoid juices with pulp.  No orange juice.  3.  You may drink clear liquids until 2PM , which is 2 hours before your procedure.                                                                                                CLEAR LIQUIDS INCLUDE: Water Jello Ice Popsicles Tea (sugar ok, no milk/cream) Powdered fruit flavored drinks Coffee (sugar ok, no milk/cream) Gatorade Juice: apple, white grape, white cranberry  Lemonade Clear bullion, consomm, broth Carbonated beverages (any kind) Strained chicken noodle soup Hard Candy   MEDICATION INSTRUCTIONS  Unless otherwise instructed, you should take regular prescription medications with a small sip of water as early as possible the morning of your procedure.              OTHER INSTRUCTIONS  You will need a responsible adult at least 60 years of age to accompany you and drive you home.   This person must remain in the waiting room during your procedure.  Wear loose fitting clothing that is easily removed.  Leave jewelry and other valuables at home.  However, you may wish to bring a book to read or an iPod/MP3 player to listen to music as you wait for your procedure to start.  Remove all body piercing jewelry and leave at home.  Total time from sign-in until discharge is approximately 2-3 hours.  You should go home directly  after your procedure and rest.  You can resume normal activities the day after your procedure.  The day of your procedure you should not:   Drive   Make legal decisions   Operate machinery   Drink alcohol   Return to work  You will receive specific instructions about eating, activities and medications before you leave.    The above instructions have been reviewed and explained to me by   _______________________    I fully understand and can verbalize these instructions _____________________________ Date _________

## 2010-08-07 LAB — POCT I-STAT, CHEM 8
Calcium, Ion: 1.13 mmol/L (ref 1.12–1.32)
Creatinine, Ser: 1 mg/dL (ref 0.4–1.2)
Glucose, Bld: 93 mg/dL (ref 70–99)
HCT: 42 % (ref 36.0–46.0)
Hemoglobin: 14.3 g/dL (ref 12.0–15.0)
Potassium: 3.5 mEq/L (ref 3.5–5.1)

## 2010-08-07 LAB — CBC
HCT: 39.2 % (ref 36.0–46.0)
Hemoglobin: 12.9 g/dL (ref 12.0–15.0)
MCH: 28.2 pg (ref 26.0–34.0)
WBC: 5.4 10*3/uL (ref 4.0–10.5)

## 2010-08-07 LAB — URINALYSIS, ROUTINE W REFLEX MICROSCOPIC
Bilirubin Urine: NEGATIVE
Nitrite: NEGATIVE
Specific Gravity, Urine: 1.02 (ref 1.005–1.030)
Urobilinogen, UA: 1 mg/dL (ref 0.0–1.0)
pH: 6 (ref 5.0–8.0)

## 2010-08-07 LAB — DIFFERENTIAL
Basophils Relative: 1 % (ref 0–1)
Eosinophils Absolute: 0.2 10*3/uL (ref 0.0–0.7)
Lymphs Abs: 1.9 10*3/uL (ref 0.7–4.0)
Monocytes Absolute: 0.8 10*3/uL (ref 0.1–1.0)
Monocytes Relative: 14 % — ABNORMAL HIGH (ref 3–12)

## 2010-08-07 LAB — URINE MICROSCOPIC-ADD ON

## 2010-08-07 LAB — POCT CARDIAC MARKERS
CKMB, poc: 1.2 ng/mL (ref 1.0–8.0)
Troponin i, poc: <0.05 ng/mL (ref 0.00–0.09)

## 2010-08-07 LAB — URINE CULTURE

## 2010-08-11 LAB — BASIC METABOLIC PANEL
Calcium: 8.3 mg/dL — ABNORMAL LOW (ref 8.4–10.5)
Calcium: 8.4 mg/dL (ref 8.4–10.5)
Calcium: 8.4 mg/dL (ref 8.4–10.5)
GFR calc Af Amer: >60 mL/min (ref >60)
GFR calc Af Amer: >60 mL/min (ref >60)
GFR calc Af Amer: >60 mL/min (ref >60)
GFR calc non Af Amer: 57 mL/min — ABNORMAL LOW (ref >60)
GFR calc non Af Amer: >60 mL/min (ref >60)
GFR calc non Af Amer: >60 mL/min (ref >60)
Glucose, Bld: 108 mg/dL — ABNORMAL HIGH (ref 70–99)
Glucose, Bld: 109 mg/dL — ABNORMAL HIGH (ref 70–99)
Potassium: 3.3 mEq/L — ABNORMAL LOW (ref 3.5–5.1)
Sodium: 137 mEq/L (ref 135–145)
Sodium: 138 mEq/L (ref 135–145)
Sodium: 139 mEq/L (ref 135–145)

## 2010-08-11 LAB — POCT CARDIAC MARKERS
Myoglobin, poc: 173 ng/mL (ref 12–200)
Troponin i, poc: <0.05 ng/mL (ref 0.00–0.09)

## 2010-08-11 LAB — CBC
Hemoglobin: 10.1 g/dL — ABNORMAL LOW (ref 12.0–15.0)
Hemoglobin: 11 g/dL — ABNORMAL LOW (ref 12.0–15.0)
Hemoglobin: 11.5 g/dL — ABNORMAL LOW (ref 12.0–15.0)
Hemoglobin: 9.9 g/dL — ABNORMAL LOW (ref 12.0–15.0)
MCHC: 34 g/dL (ref 30.0–36.0)
MCV: 86.8 fL (ref 78.0–100.0)
Platelets: 104 10*3/uL — ABNORMAL LOW (ref 150–400)
RBC: 3.44 MIL/uL — ABNORMAL LOW (ref 3.87–5.11)
RBC: 3.75 MIL/uL — ABNORMAL LOW (ref 3.87–5.11)
RBC: 3.99 MIL/uL (ref 3.87–5.11)
RDW: 15.1 % (ref 11.5–15.5)
RDW: 15.3 % (ref 11.5–15.5)
RDW: 15.5 % (ref 11.5–15.5)
WBC: 2.4 10*3/uL — ABNORMAL LOW (ref 4.0–10.5)
WBC: 2.4 10*3/uL — ABNORMAL LOW (ref 4.0–10.5)

## 2010-08-11 LAB — DIFFERENTIAL
Basophils Absolute: 0 10*3/uL (ref 0.0–0.1)
Lymphocytes Relative: 29 % (ref 12–46)
Lymphs Abs: 0.7 10*3/uL (ref 0.7–4.0)
Monocytes Absolute: 0.4 10*3/uL (ref 0.1–1.0)
Neutro Abs: 1.3 10*3/uL — ABNORMAL LOW (ref 1.7–7.7)

## 2010-08-11 LAB — CK TOTAL AND CKMB (NOT AT ARMC)
CK, MB: 1 ng/mL (ref 0.3–4.0)
Relative Index: 0.9 (ref 0.0–2.5)
Total CK: 106 U/L (ref 7–177)

## 2010-08-11 LAB — TSH: TSH: 1.015 u[IU]/mL (ref 0.350–4.500)

## 2010-08-11 LAB — HEPATIC FUNCTION PANEL
ALT: 50 U/L — ABNORMAL HIGH (ref 0–35)
AST: 55 U/L — ABNORMAL HIGH (ref 0–37)
Alkaline Phosphatase: 81 U/L (ref 39–117)
Bilirubin, Direct: 0.2 mg/dL (ref 0.0–0.3)

## 2010-08-11 LAB — LIPID PANEL
Cholesterol: 145 mg/dL (ref 0–200)
HDL: 41 mg/dL (ref >39)
Total CHOL/HDL Ratio: 3.5 RATIO

## 2010-08-11 LAB — RPR: RPR Ser Ql: NONREACTIVE

## 2010-08-11 LAB — URINALYSIS, ROUTINE W REFLEX MICROSCOPIC
Nitrite: NEGATIVE
Specific Gravity, Urine: 1.025 (ref 1.005–1.030)
Urobilinogen, UA: 1 mg/dL (ref 0.0–1.0)
pH: 6 (ref 5.0–8.0)

## 2010-08-11 LAB — POCT I-STAT, CHEM 8
BUN: 11 mg/dL (ref 6–23)
Chloride: 108 mEq/L (ref 96–112)
Sodium: 141 mEq/L (ref 135–145)

## 2010-08-11 LAB — COMPREHENSIVE METABOLIC PANEL
ALT: 39 U/L — ABNORMAL HIGH (ref 0–35)
AST: 47 U/L — ABNORMAL HIGH (ref 0–37)
Albumin: 2.8 g/dL — ABNORMAL LOW (ref 3.5–5.2)
Calcium: 8.2 mg/dL — ABNORMAL LOW (ref 8.4–10.5)
GFR calc Af Amer: >60 mL/min (ref >60)
Sodium: 138 mEq/L (ref 135–145)
Total Protein: 6.8 g/dL (ref 6.0–8.3)

## 2010-08-11 LAB — FOLATE RBC: RBC Folate: 584 ng/mL (ref 180–600)

## 2010-08-11 LAB — APTT: aPTT: 27 seconds (ref 24–37)

## 2010-10-04 ENCOUNTER — Emergency Department (HOSPITAL_COMMUNITY)
Admission: EM | Admit: 2010-10-04 | Discharge: 2010-10-04 | Disposition: A | Discharge status: Home / Self Care | Payer: Medicare Other | Attending: Emergency Medicine | Admitting: Emergency Medicine

## 2010-10-04 DIAGNOSIS — G35 Multiple sclerosis: Secondary | ICD-10-CM | POA: Insufficient documentation

## 2010-10-04 DIAGNOSIS — N390 Urinary tract infection, site not specified: Secondary | ICD-10-CM | POA: Insufficient documentation

## 2010-10-04 DIAGNOSIS — K219 Gastro-esophageal reflux disease without esophagitis: Secondary | ICD-10-CM | POA: Insufficient documentation

## 2010-10-04 DIAGNOSIS — R109 Unspecified abdominal pain: Secondary | ICD-10-CM | POA: Insufficient documentation

## 2010-10-04 DIAGNOSIS — J45909 Unspecified asthma, uncomplicated: Secondary | ICD-10-CM | POA: Insufficient documentation

## 2010-10-04 DIAGNOSIS — B1920 Unspecified viral hepatitis C without hepatic coma: Secondary | ICD-10-CM | POA: Insufficient documentation

## 2010-10-04 LAB — BASIC METABOLIC PANEL
Calcium: 9.2 mg/dL (ref 8.4–10.5)
Creatinine, Ser: 0.78 mg/dL (ref 0.4–1.2)
GFR calc Af Amer: >60 mL/min (ref >60)

## 2010-10-04 LAB — CBC
Hemoglobin: 14.6 g/dL (ref 12.0–15.0)
RBC: 5.11 MIL/uL (ref 3.87–5.11)
WBC: 4 10*3/uL (ref 4.0–10.5)

## 2010-10-04 LAB — URINE MICROSCOPIC-ADD ON

## 2010-10-04 LAB — URINALYSIS, ROUTINE W REFLEX MICROSCOPIC
Protein, ur: NEGATIVE mg/dL
Urobilinogen, UA: 1 mg/dL (ref 0.0–1.0)

## 2010-10-04 LAB — POCT CARDIAC MARKERS
CKMB, poc: 1.4 ng/mL (ref 1.0–8.0)
Myoglobin, poc: 82.5 ng/mL (ref 12–200)
Troponin i, poc: <0.05 ng/mL (ref 0.00–0.09)

## 2010-10-04 LAB — DIFFERENTIAL
Basophils Absolute: 0 10*3/uL (ref 0.0–0.1)
Basophils Relative: 1 % (ref 0–1)
Monocytes Relative: 9 % (ref 3–12)
Neutro Abs: 1.9 10*3/uL (ref 1.7–7.7)
Neutrophils Relative %: 46 % (ref 43–77)

## 2010-10-06 LAB — URINE CULTURE
Colony Count: >=100000
Culture  Setup Time: 201205130021

## 2010-10-10 NOTE — Procedures (Signed)
[facility]                            ABDOMINAL ULTRASOUND REPORT   NAME:Deborah Reid, Deborah Reid                    MRN:          [id number]  DATE:01/20/2006                            DOB:          02/27/1951    ORDERING PHYSICIAN:  Barbette Hair. Artist Pais, DO.   READING PHYSICIAN:  Malcom T. Russella Dar, MD, Clementeen Graham.   ACCESSION NUMBER:  [id number].   PROCEDURE:  Multiplanar abdominal ultrasound imaging was performed in the  upright, supine, right and left lateral decubitus positions.   RESULTS:  Abdominal aorta measuring 1.7 cm.  The IVC is patent.   The pancreas appears normal throughout the head, body and tail without  evidence of ductal dilatation, pancreatic masses, or peripancreatic  inflammation.   In the gallbladder there is a 2 cm mobile and shadowing stone.  The wall  thickness is 2.5.  gallbladder is well distended.  No pericholecystic fluid  or intraluminal echogenic foci to suggest gallstone disease.   The common bile duct measures 2.3 mm in maximal diameter without evidence of  intraluminal foci.   The liver appears normal without evidence of parenchymal lesion, ductal  dilatation or vascular abnormality.   Kidneys are normal in appearance.  The right kidney measures 8.8 cm, the  left kidney 9.2 cm.   Spleen is normal in size, measuring 9.9 cm without parenchymal lesion.   IMPRESSION:  Cholelithiasis.                                   Venita Lick. Pleas Koch., MD, Clementeen Graham   MTS/MedQ  DD:  01/20/2006  DT:  01/21/2006  Job #:  [id number]   cc:   Barbette Hair. Artist Pais, DO

## 2010-10-10 NOTE — H&P (Signed)
NAME:  Deborah Reid, Deborah Reid NO.:  [id number]   MEDICAL RECORD NO.:  [id number]                   PATIENT TYPE:  INP   LOCATION:  6524                                 FACILITY:  [facility]   PHYSICIAN:  W. Ashley Royalty., M.D.         DATE OF BIRTH:  10/17/1950   DATE OF ADMISSION:  03/16/2003  DATE OF DISCHARGE:                                HISTORY & PHYSICAL   HISTORY:  This 60 year old black female is seen for evaluation and to rule  out a myocardial infarction.  The patient is a sketchy historian and has no  primary medical physician.  She reported being seen in the Internal Medicine  Clinic several years ago when she had insurance and was diagnosed with  asthma and takes albuterol inhalers as needed.  She is unemployed and has  chronic low back pain and is in the process of trying to get disability.  She had the onset of midsternal chest pressure and heaviness while watching  television last evening.  She developed palpitations, sweating, heaviness  and did not have any radiation.  She had some mild shortness of breath with  it.  There were no aggravating or relieving factors with it.  She had sinus  tachycardia and called EMS and was given aspirin and three nitroglycerin  with improvement in the chest discomfort.  An EKG was unremarkable and she  was admitted last evening by the emergency room physician to the unit to  rule out a myocardial infarction.  She had a set of cardiac markers drawn,  but no other blood work drawn last evening.  She has no prior history of  cardiac disease.   PAST MEDICAL HISTORY:   MEDICAL:  No history of hypertension or diabetes.  Does have obesity and may  have had elevated cholesterol in the past.   PREVIOUS SURGERY:  None.   ALLERGIES:  None.   MEDICATIONS:  Albuterol as needed (p.r.n.).   FAMILY HISTORY:  Father died in a coma.  Mother has had some lung problems.  She has seven brothers and sisters; her younger  sister has some cardiac  disease.   SOCIAL HISTORY:  She is divorced.  She has three children.  She currently  lives with a friend.  She is unemployed but formerly worked in a  Financial trader for [employer].   REVIEW OF SYSTEMS:  She has obesity.  She has no eyes, ear, nose or throat  problems, no difficulty swallowing.  She does have a history of epigastric  burning and a history of ulcers.  There is a possible history of acid  reflux.  She has occasional swelling of her ankles, some difficulty passing  her water at times.  She has chronic low back pain and also has generalized  aching in her shoulders.  Other than as noted above, the remainder of the  review of systems is unremarkable.  PHYSICAL EXAMINATION:  GENERAL:  Pleasant black female, soft-spoken, in no  acute distress.  VITAL SIGNS:  Blood pressure 130/80.  Pulse 70 and regular.  SKIN:  Skin warm and dry.  EENT:  EOMI.  PERLA.  C&S clear.  Pharynx negative.  NECK:  Neck supple without masses, JVD, thyromegaly or bruits.  LUNGS:  Lungs clear to A&P.  CARDIAC:  Normal S1 and S2.  No S3.  ABDOMEN:  Abdomen soft and nontender.  EXTREMITIES:  Femoral and distal pulses 2+.   LABORATORY AND ACCESSORY CLINICAL DATA:  Twelve-lead EKG is normal.   IMPRESSION:  1. Prolonged chest discomfort with features suggestive of unstable angina     relieved with nitroglycerin and aspirin.  2. History of low back pain.  3. Obesity.  4. Possible history of reflux.   RECOMMENDATIONS:  No laboratory work was done in the emergency room last  night and plan to obtain CBC, TSH, chemistry panel, another set of cardiac  enzymes.  If the lab is fine, we will plan to proceed with cardiac  catheterization later on this afternoon to evaluate possible coronary artery  disease.  The procedure was discussed with the patient fully including the  risks of myocardial infarction, death, stroke, bleeding, arrhythmia, dye  allergy, renal failure or  arterial damage.  Also, possibility of stenting or  angioplasty were also discussed with the patient.  The patient is agreeable  and willing to proceed.                                                Darden Palmer., M.D.    WST/MEDQ  D:  03/16/2003  T:  03/16/2003  Job:  [id number]

## 2010-10-10 NOTE — Cardiovascular Report (Signed)
   NAME:  Deborah Reid, Deborah Reid NO.:  [id number]   MEDICAL RECORD NO.:  [id number]                   PATIENT TYPE:  INP   LOCATION:  6524                                 FACILITY:  [facility]   PHYSICIAN:  W. Ashley Royalty., M.D.         DATE OF BIRTH:  [birth year]   DATE OF PROCEDURE:  03/16/2003  DATE OF DISCHARGE:                              CARDIAC CATHETERIZATION   PROCEDURES PERFORMED:   CARDIOLOGIST:  Georga Hacking, M.D.   HISTORY:  [age]-year-old black female with history of asthma who  presented with prolonged chest discomfort described as squeezing in  pressure, relieved with nitroglycerin by EMS.  MI has been ruled out.  Catheterization done to exclude significant coronary artery disease.   COMMENTS ABOUT PROCEDURE:  The patient tolerated the procedure well without  complications, had good hemostasis and peripheral pulses noted at the end of  the procedure.   HEMODYNAMIC DATA:  Aorta post contrast 152/77.  LV post contrast 152/8.   ANGIOGRAPHIC DATA:  Left ventriculogram performed in the 30-degree RAO  projection.  The aortic valve appears normal.  The mitral valve appears  normal.   Left Ventricle:  Left ventricle appears normal in size.  Wall motion is  normal.  Estimated ejection fraction is 60%.   Coronary Arteries:  Coronary arteries arise and distribute normally.  No  significant calcification is noted.   Left Main Coronary Artery:  The left main coronary artery normal.   Left Anterior Descending Artery:  The left anterior descending is a large  vessel, which supplies the apex and contains two small diagonal branches.  Minimal irregularities, but no significant stenoses noted.   Circumflex Coronary Artery:  Two large marginal arteries are noted, which  are free of disease.  A smaller second marginal artery is also noted.   Right Coronary Artery:  The right coronary artery is a somewhat small vessel  that contains no  significant stenoses.   IMPRESSION:  1. No significant coronary artery disease identified.  2. Normal left ventricular function.    RECOMMENDATIONS:  1. Look for noncardiac causes for chest pain.  2. Evaluate abnormal liver enzymes and elevated glucose.  3. Internal medicine consult.                                                 Darden Palmer., M.D.    WST/MEDQ  D:  03/16/2003  T:  03/16/2003  Job:  [id number]

## 2010-10-10 NOTE — Assessment & Plan Note (Signed)
[facility]                             PRIMARY CARE OFFICE NOTE   NAME:Deborah Reid, Deborah Reid                    MRN:          [id number]  DATE:01/19/2006                            DOB:          02/27/1951    CHIEF COMPLAINT:  New patient to practice/abdominal pain.   HISTORY OF PRESENT ILLNESS:  Patient is a 60 year old African American  female here to establish primary care.  She has not had a primary care  doctor in the past.  She has been fairly healthy, however, approximately one  year ago she experienced abdominal pain.  [facility] including abdominal ultrasound where it was  discovered patient  had gallstones.  Patient reports that one of the  gallstones was fairly large.  She did not follow up with a surgeon due to  lack of insurance.  Patient has been, since that time, experiencing  intermittent abdominal pain on and off that has worsened within the last few  days.  Patient describes chronic constipation and this morning experienced  some nausea, one episode of vomiting.  She states that due to her  headache  and also some back pain, she has taken Aleve every day for the last week.  She has a history of regular NSAID use in the past as well.   Approximately one month ago she also described some clay colored stools,  however, since that time, patient's stools have returned to normal, light  brown in color.  She has not experienced any fever today.  There is some  radiation of her abdominal pain to her back.   PAST MEDICAL HISTORY:  1. Abdominal pain with cholelithiasis.  2. History of asthma, quiescent.  3. History of arthritis.   CURRENT MEDICATION:  Aleve once a day.   ALLERGIES:  NO KNOWN DRUG ALLERGIES.   SOCIAL HISTORY:  Patient is married, lives with her husband, works as a  Government social research officer, has three daughters that are grown.   FAMILY HISTORY:  Mother is age 50, father is deceased at  age 62 secondary to  complications of alcoholic cirrhosis.   HABITS:  She does not drink.  She quit tobacco at age 27, she only smoked  for approximately one year.   REVIEW OF HER ELECTRONIC MEDICAL RECORD AT [facility]:  Her last known abdominal ultrasound was performed in 2001. Patient report of  abdominal ultrasound one year ago is not available and her blood tests are  from 2001.   REVIEW OF SYSTEMS:  As noted above, no fever or chills.  No HEENT symptoms.  Patient denies any chest pain or shortness of breath.  GI symptoms as noted  above.  No dysuria, frequency or urgency.  All other systems negative.  She  denies history of heart disease, type 2 diabetes.   PHYSICAL EXAMINATION:  VITAL SIGNS:  Height is 5 feet 2 inches, weight 168  pounds, temperature 98.3, pulses 84, blood pressure 139/77 in the left arm  in the seated position.  GENERAL:  The patient is a somewhat  overweight 60 year old pleasant African  American female in no apparent distress.  HEENT:  Normocephalic and atraumatic.  Pupils are equal, round and reactive  to light bilaterally.  Extraocular motility was intact.  Patient was  anicteric.  Conjunctiva was within normal limits.  External auditory canal  and tympanic membranes are clear bilaterally.  Oropharyngeal exam is  unremarkable.  NECK:  Supple, no adenopathy, carotid bruit, no thyromegaly.  CHEST:  Normal respiratory effort.  No wheezing, rhonchi or rales.  CARDIOVASCULAR:  Regular rate and rhythm, no significant murmurs, rubs, or  gallops appreciated.  ABDOMEN:  Soft.  Patient had mid epigastric tenderness.  No right upper  quadrant pain.  Negative Murphy's.  Mild right lower quadrant discomfort.  No rebound or guarding noted.  MUSCULOSKELETAL:  No clubbing, cyanosis, or edema.  SKIN:  Warm and dry.  No evidence of jaundice.  NEUROLOGIC:  Cranial nerves II-XII grossly intact.  Nonfocal.   IMPRESSION/RECOMMENDATIONS:  1. Abdominal  pain.  2. History of gallstones.  3. Chronic nonsteroidal anti-inflammatory drug use.  4. Elevated blood pressure without diagnosis of hypertension.  5. __________   RECOMMENDATIONS:  Unclear at this time exact etiology of patient's abdominal  pain.  Patient certainly at risk for gastritis secondary to chronic NSAID  use.  Patient's radiation of abdominal pain somewhat concerning for possible  pancreatitis.  She will be sent for stat labs including liver function  tests, amylase, lipase and an abdominal ultrasound will be scheduled for  tomorrow morning.  Patient currently does not have any signs of cholangitis.   If ultrasound is positive for gallstones or dilated common bile duct,  patient will be referred to surgery for elective cholecystectomy.  She may  need intraoperative cholangiogram.   She was advised to discontinue all NSAIDs and was started on Nexium 40 mg  p.o. b.i.d. with samples and then daily a.c. thereafter.  Follow-up time is  two days.                                  Barbette Hair. Artist Pais, DO   RDY/MedQ  DD:  01/19/2006 DT:  01/20/2006 Job #:  [id number]

## 2010-10-10 NOTE — Op Note (Signed)
NAMESHERRAL, DIROCCO               ACCOUNT NO.:  [id number]   MEDICAL RECORD NO.:  [id number]          PATIENT TYPE:  AMB   LOCATION:  SDS                          FACILITY:  [facility]   PHYSICIAN:  Wilmon Arms. Corliss Skains, M.D. DATE OF BIRTH:  08/12/1950   DATE OF PROCEDURE:  04/19/2006  DATE OF DISCHARGE:                               OPERATIVE REPORT   PREOPERATIVE DIAGNOSIS:  Chronic calculus cholecystitis.   POSTOPERATIVE DIAGNOSIS:  Chronic calculus cholecystitis.   PROCEDURE PERFORMED:  Laparoscopic cholecystectomy with intraoperative  cholangiogram.   SURGEON:  Wilmon Arms. Tsuei, M.D.   ANESTHESIA:  General endotracheal.   INDICATIONS:  The patient is a 60 year old female who presents with a 1-  year history of intermittent upper abdominal pain radiating through to  her right side into her back.  She also has some nausea, vomiting,  diarrhea.  She has been diagnosed with gallstones and presents for  cholecystectomy.   DESCRIPTION OF PROCEDURE:  The patient brought to the operating room and  placed in the supine position on the operating room table.  After an  adequate level of general anesthesia was obtained, the patient's abdomen  was prepped with Betadine and draped in a sterile fashion.  A timeout  was taken to assure the proper patient and proper procedure.  The area  below her umbilicus was infiltrated with 0.25% Marcaine.   A transverse incision was made below her umbilicus, and dissection was  carried down to the fascia.  The fascia was opened vertically, and the  peritoneal cavity was bluntly entered.  A stay suture of 0 Vicryl was  placed around the fascial opening.  The Hasson cannula was inserted and  secured with the stay suture.  Pneumoperitoneum was obtained by  insufflating CO2, maintaining a maximum pressure of 15 mmHg.  The  laparoscope was then inserted, and a normal-appearing liver and stomach  were seen.  The patient was positioned in reverse Trendelenburg  position  tilted to her left.  A 10-mm port was placed in the subxiphoid position  and two 5-mm ports in the right upper quadrant.  The gallbladder was  grasped with a clamp and elevated over the edge of the liver.  There  were some omental adhesions to the surface of the gallbladder which were  taken down with cautery.  The hilum of the gallbladder was dissected  free of the surrounding adhesions.  We were able identify the cystic  duct and the cystic artery.  The cystic artery was ligated with clips  and divided.  The cystic duct was ligated with a clip distally.  A small  opening was created on the cystic duct, and a Cook cholangiogram  catheter was threaded through a stab incision and inserted into the  cystic duct.  It was secured with a clip.  A cholangiogram was obtained  using fluoroscopy which showed good flow proximally and distally in the  biliary tree, with no evidence of filling defects.  There was good flow  into the duodenum.  The catheter was then removed.  The cystic duct was  then ligated with  clips and divided.  The gallbladder was then dissected  free from the liver using cautery.  The gallbladder was placed in  EndoCatch sac.  We reinspected the gallbladder fossa for hemostasis.  The gallbladder was then removed through the umbilical port site.  The  irrigant was suctioned out of right upper quadrant.  Pneumoperitoneum  was then released as the ports were removed.  A pursestring suture was  used to close the umbilical fascia.  A 4-0 Monocryl was used to close  all of the skin incisions.  Steri-Strips and clean dressings were  applied.   The patient was extubated and brought to the recovery room in stable  condition.  All sponge, instrument, and needle counts were correct.      Wilmon Arms. Tsuei, M.D.  Electronically Signed     MKT/MEDQ  D:  04/19/2006  T:  04/19/2006  Job:  [id number]   cc:   Barbette Hair. Artist Pais, DO

## 2011-02-18 LAB — CBC
MCV: 83.8
Platelets: 190
RDW: 12.9
WBC: 5

## 2011-02-18 LAB — POCT I-STAT, CHEM 8
Creatinine, Ser: 0.9
HCT: 43
Hemoglobin: 14.6
Potassium: 4.2
Sodium: 143
TCO2: 27

## 2011-02-18 LAB — DIFFERENTIAL
Basophils Absolute: 0
Eosinophils Absolute: 0.1
Lymphs Abs: 1.8
Neutrophils Relative %: 47

## 2011-02-27 LAB — I-STAT 8, (EC8 V) (CONVERTED LAB)
Acid-base deficit: 1
Bicarbonate: 25.5 — ABNORMAL HIGH
HCT: 44
Operator id: 198171
TCO2: 27
pCO2, Ven: 47.2
pH, Ven: 7.34 — ABNORMAL HIGH

## 2011-02-27 LAB — POCT I-STAT CREATININE
Creatinine, Ser: 1
Operator id: 198171

## 2011-06-25 ENCOUNTER — Ambulatory Visit: Payer: Medicare Other | Admitting: Gastroenterology

## 2011-10-25 ENCOUNTER — Encounter (HOSPITAL_COMMUNITY): Payer: Self-pay | Admitting: Emergency Medicine

## 2011-10-25 ENCOUNTER — Emergency Department (HOSPITAL_COMMUNITY)
Admission: EM | Admit: 2011-10-25 | Discharge: 2011-10-25 | Disposition: A | Discharge status: Home / Self Care | Payer: Medicare Other | Attending: Emergency Medicine | Admitting: Emergency Medicine

## 2011-10-25 DIAGNOSIS — R002 Palpitations: Secondary | ICD-10-CM

## 2011-10-25 DIAGNOSIS — Z8619 Personal history of other infectious and parasitic diseases: Secondary | ICD-10-CM | POA: Insufficient documentation

## 2011-10-25 HISTORY — DX: Depression, unspecified: F32.A

## 2011-10-25 HISTORY — DX: Multiple sclerosis: G35

## 2011-10-25 HISTORY — DX: Major depressive disorder, single episode, unspecified: F32.9

## 2011-10-25 HISTORY — DX: Unspecified viral hepatitis C without hepatic coma: B19.20

## 2011-10-25 LAB — BASIC METABOLIC PANEL
BUN: 12 mg/dL (ref 6–23)
CO2: 24 mEq/L (ref 19–32)
Calcium: 9 mg/dL (ref 8.4–10.5)
Chloride: 106 mEq/L (ref 96–112)
Creatinine, Ser: 0.87 mg/dL (ref 0.50–1.10)
GFR calc Af Amer: 82 mL/min — ABNORMAL LOW (ref >90)
GFR calc non Af Amer: 70 mL/min — ABNORMAL LOW (ref >90)
Glucose, Bld: 98 mg/dL (ref 70–99)
Potassium: 3.5 mEq/L (ref 3.5–5.1)
Sodium: 138 mEq/L (ref 135–145)

## 2011-10-25 LAB — CBC
HCT: 40.4 % (ref 36.0–46.0)
Hemoglobin: 13.2 g/dL (ref 12.0–15.0)
MCH: 27.9 pg (ref 26.0–34.0)
MCHC: 32.7 g/dL (ref 30.0–36.0)
MCV: 85.4 fL (ref 78.0–100.0)
Platelets: 169 10*3/uL (ref 150–400)
RBC: 4.73 MIL/uL (ref 3.87–5.11)
RDW: 13.2 % (ref 11.5–15.5)
WBC: 4.7 10*3/uL (ref 4.0–10.5)

## 2011-10-25 LAB — CARBOXYHEMOGLOBIN
Carboxyhemoglobin: 0.4 % — ABNORMAL LOW (ref 0.5–1.5)
Methemoglobin: 0.8 % (ref 0.0–1.5)
O2 Saturation: 22.3 %
Total hemoglobin: 13.2 g/dL (ref 12.5–16.0)

## 2011-10-25 LAB — TROPONIN I: Troponin I: <0.3 ng/mL (ref <0.30)

## 2011-10-25 MED ORDER — SODIUM CHLORIDE 0.9 % IV BOLUS (SEPSIS)
1000.0000 mL | Freq: Once | INTRAVENOUS | Status: AC
  Administered 2011-10-25: 1000 mL via INTRAVENOUS

## 2011-10-25 NOTE — Discharge Instructions (Signed)
Palpitations  A palpitation is the feeling that your heartbeat is irregular or is faster than normal. Although this is frightening, it usually is not serious. Palpitations may be caused by excesses of smoking, caffeine, or alcohol. They are also brought on by stress and anxiety. Sometimes, they are caused by heart disease. Unless otherwise noted, your caregiver did not find any signs of serious illness at this time. HOME CARE INSTRUCTIONS  To help prevent palpitations:  Drink decaffeinated coffee, tea, and soda pop. Avoid chocolate.   If you smoke or drink alcohol, quit or cut down as much as possible.   Reduce your stress or anxiety level. Biofeedback, yoga, or meditation will help you relax. Physical activity such as swimming, jogging, or walking also may be helpful.  SEEK MEDICAL CARE IF:   You continue to have a fast heartbeat.   Your palpitations occur more often.  SEEK IMMEDIATE MEDICAL CARE IF: You develop chest pain, shortness of breath, severe headache, dizziness, or fainting. Document Released: 05/08/2000 Document Revised: 04/30/2011 Document Reviewed: 07/08/2007 ExitCare Patient Information 2012 [employer]. 

## 2011-10-25 NOTE — ED Notes (Signed)
Pt presenting to ed with c/o palpitations x couple of weeks pt states positive dizziness today with blurred visions. Pt states no chest pain at this time it just feels like her heart is skipping a beat and it has felt this way for a while. Pt denies shortness of breath, nausea and vomiting at this time.

## 2011-10-29 NOTE — ED Provider Notes (Addendum)
History    61yf with palpitations. Have been going on for weeks but worse today. No appreciable exacerbating or relieving factors. Lasts seconds. Denies associated symptoms such as CP, SOB, dizziness or lightheadedness. Denies new meds, supplements or OTCs. Denies singificant caffeine intake. No fever or chills. Denies hx of thyroid problems. Pt also concerned for possible XO poisoning because has "bad stove" at home with pilot light that goes out all the time. Occasional mild HA. No one else in house with similar though. No confusion. No n/v.   CSN: [id number]  Arrival date & time 10/25/11  1252   First MD Initiated Contact with Patient 10/25/11 1347      Chief Complaint  Patient presents with  . Palpitations    (Consider location/radiation/quality/duration/timing/severity/associated sxs/prior treatment) HPI  Past Medical History  Diagnosis Date  . Depression   . Hepatitis C   . Multiple sclerosis     Past Surgical History  Procedure Date  . Cholecystectomy     No family history on file.  History  Substance Use Topics  . Smoking status: Never Smoker   . Smokeless tobacco: Not on file  . Alcohol Use: No    OB History    Grav Para Term Preterm Abortions TAB SAB Ect Mult Living                  Review of Systems   Review of symptoms negative unless otherwise noted in HPI.   Allergies  Review of patient's allergies indicates no known allergies.  Home Medications  No current outpatient prescriptions on file.  BP 139/61  Pulse 74  Temp(Src) 98.2 F (36.8 C) (Oral)  Resp 15  SpO2 100%  Physical Exam  Nursing note and vitals reviewed. Constitutional: She appears well-developed and well-nourished. No distress.  HENT:  Head: Normocephalic and atraumatic.  Eyes: Conjunctivae are normal. Pupils are equal, round, and reactive to light. Right eye exhibits no discharge. Left eye exhibits no discharge.  Neck: Neck supple.  Cardiovascular: Normal rate, regular  rhythm and normal heart sounds.  Exam reveals no gallop and no friction rub.   No murmur heard. Pulmonary/Chest: Effort normal and breath sounds normal. No respiratory distress.  Abdominal: Soft. She exhibits no distension. There is no tenderness.  Musculoskeletal: She exhibits no edema and no tenderness.       Lower extremities symmetric as compared to each other. No calf tenderness. Negative Homan's. No palpable cords.   Neurological: She is alert.  Skin: Skin is warm and dry.  Psychiatric: She has a normal mood and affect. Her behavior is normal. Thought content normal.    ED Course  Procedures (including critical care time)  Labs Reviewed  BASIC METABOLIC PANEL - Abnormal; Notable for the following:    GFR calc non Af Amer 70 (*)    GFR calc Af Amer 82 (*)    All other components within normal limits  CARBOXYHEMOGLOBIN - Abnormal; Notable for the following:    Carboxyhemoglobin 0.4 (*)    All other components within normal limits  CBC  TROPONIN I  LAB REPORT - SCANNED   No results found.  EKG:  Rhythm: Normal sinus Rate: 79 Axis: Normal Intervals: Poor R wave progression ST segments: Nonspecific ST changes inferiorly  1. Palpitations       MDM  10f with palpitations. Etiology unclear. EKG with no ectopy and normal intervals. W/u unremarkable. Feel that can be safely discharged at this time. Return precautions dicussed. Outpt fu otherwise.  Raeford Razor, MD 10/29/11 9562  Raeford Razor, MD 12/11/11 [phone]

## 2012-05-12 ENCOUNTER — Encounter (HOSPITAL_COMMUNITY): Payer: Self-pay

## 2012-05-12 ENCOUNTER — Emergency Department (HOSPITAL_COMMUNITY): Payer: Medicare Other

## 2012-05-12 ENCOUNTER — Emergency Department (HOSPITAL_COMMUNITY)
Admission: EM | Admit: 2012-05-12 | Discharge: 2012-05-12 | Disposition: A | Discharge status: Home / Self Care | Payer: Medicare Other | Attending: Emergency Medicine | Admitting: Emergency Medicine

## 2012-05-12 DIAGNOSIS — R5383 Other fatigue: Secondary | ICD-10-CM | POA: Insufficient documentation

## 2012-05-12 DIAGNOSIS — R0602 Shortness of breath: Secondary | ICD-10-CM | POA: Insufficient documentation

## 2012-05-12 DIAGNOSIS — R509 Fever, unspecified: Secondary | ICD-10-CM | POA: Insufficient documentation

## 2012-05-12 DIAGNOSIS — Z8669 Personal history of other diseases of the nervous system and sense organs: Secondary | ICD-10-CM | POA: Insufficient documentation

## 2012-05-12 DIAGNOSIS — F3289 Other specified depressive episodes: Secondary | ICD-10-CM | POA: Insufficient documentation

## 2012-05-12 DIAGNOSIS — R5381 Other malaise: Secondary | ICD-10-CM | POA: Insufficient documentation

## 2012-05-12 DIAGNOSIS — F329 Major depressive disorder, single episode, unspecified: Secondary | ICD-10-CM | POA: Insufficient documentation

## 2012-05-12 DIAGNOSIS — J3489 Other specified disorders of nose and nasal sinuses: Secondary | ICD-10-CM | POA: Insufficient documentation

## 2012-05-12 DIAGNOSIS — Z8619 Personal history of other infectious and parasitic diseases: Secondary | ICD-10-CM | POA: Insufficient documentation

## 2012-05-12 DIAGNOSIS — Z79899 Other long term (current) drug therapy: Secondary | ICD-10-CM | POA: Insufficient documentation

## 2012-05-12 DIAGNOSIS — J069 Acute upper respiratory infection, unspecified: Secondary | ICD-10-CM | POA: Insufficient documentation

## 2012-05-12 DIAGNOSIS — R05 Cough: Secondary | ICD-10-CM | POA: Insufficient documentation

## 2012-05-12 DIAGNOSIS — R059 Cough, unspecified: Secondary | ICD-10-CM | POA: Insufficient documentation

## 2012-05-12 MED ORDER — HYDROCODONE-ACETAMINOPHEN 7.5-500 MG/15ML PO SOLN: 15.0000 mL | Freq: Four times a day (QID) | ORAL | Status: DC | PRN

## 2012-05-12 MED ORDER — GUAIFENESIN 100 MG/5ML PO LIQD: 100.0000 mg | ORAL | Status: DC | PRN

## 2012-05-12 NOTE — ED Provider Notes (Signed)
Medical screening examination/treatment/procedure(s) were performed by non-physician practitioner and as supervising physician I was immediately available for consultation/collaboration.   Gerhard Munch, MD 05/12/12 1750

## 2012-05-12 NOTE — ED Provider Notes (Signed)
History     CSN: [id number]  Arrival date & time 05/12/12  1332   First MD Initiated Contact with Patient 05/12/12 1422      No chief complaint on file.   (Consider location/radiation/quality/duration/timing/severity/associated sxs/prior treatment) HPI  61 year old female with history of multiple sclerosis, hepatitis C, depression, presents with flu-like symptoms.  Patient reports gradual and persistent onset of runny nose, nasal congestion, productive cough, general body aches, nausea, diarrhea and subjective fever for the past 2 weeks. Cough is productive with yellow sputum.  Diarrhea is nonbloody, non-mucousy, 2-3 bouts per day for the past 2 days. Patient has history of asthma and has been using her rescue inhaler as needed 3 times in the past 2 weeks. She has been taking Mucinex, cough drops with some relief. Otherwise she denies cold chills, abdominal pain, back pain, dysuria, or rash. She is not on any antibiotic. She is a nonsmoker. She is here for evaluation due to the duration of the symptoms.  Past Medical History  Diagnosis Date  . Depression   . Hepatitis C   . Multiple sclerosis     Past Surgical History  Procedure Date  . Cholecystectomy     History reviewed. No pertinent family history.  History  Substance Use Topics  . Smoking status: Never Smoker   . Smokeless tobacco: Not on file  . Alcohol Use: No    OB History    Grav Para Term Preterm Abortions TAB SAB Ect Mult Living                  Review of Systems  Constitutional: Positive for fever and fatigue. Negative for chills.  HENT: Positive for congestion and rhinorrhea. Negative for sore throat, sneezing and voice change.   Respiratory: Positive for cough and shortness of breath. Negative for wheezing.   Cardiovascular: Negative for chest pain.  Gastrointestinal: Negative for abdominal pain.  Skin: Negative for rash.    Allergies  Review of patient's allergies indicates no known  allergies.  Home Medications   Current Outpatient Rx  Name  Route  Sig  Dispense  Refill  . ESCITALOPRAM OXALATE 5 MG PO TABS   Oral   Take 5 mg by mouth daily.         . GUAIFENESIN ER 600 MG PO TB12   Oral   Take 600 mg by mouth 2 (two) times daily as needed. For cough         . IBUPROFEN 200 MG PO TABS   Oral   Take 200 mg by mouth every 8 (eight) hours as needed. For pain           BP 166/65  Pulse 88  Temp 98.7 F (37.1 C) (Oral)  Resp 20  SpO2 92%  Physical Exam  Nursing note and vitals reviewed. Constitutional: She is oriented to person, place, and time. She appears well-developed and well-nourished. No distress.       Awake, alert, nontoxic appearance  HENT:  Head: Atraumatic.  Eyes: Conjunctivae normal are normal. Right eye exhibits no discharge. Left eye exhibits no discharge.  Neck: Neck supple.  Cardiovascular: Normal rate and regular rhythm.   Pulmonary/Chest: Effort normal. No respiratory distress. She has no wheezes. She has no rales. She exhibits no tenderness.  Abdominal: Soft. There is no tenderness. There is no rebound.  Musculoskeletal: She exhibits no edema and no tenderness.       ROM appears intact, no obvious focal weakness  Neurological: She is alert  and oriented to person, place, and time.       Mental status and motor strength appears intact  Skin: No rash noted.  Psychiatric: She has a normal mood and affect.    ED Course  Procedures (including critical care time)  Dg Chest 2 View  05/12/2012  *RADIOLOGY REPORT*  Clinical Data: Cough for 2 weeks, increasing congestion.  CHEST - 2 VIEW  Comparison: 01/07/2010.  Findings: The heart, mediastinal and hilar contours are normal. The lungs are clear.  No effusions or pneumothoraces.  No acute bony changes.  IMPRESSION: No active disease.   Original Report Authenticated By: Sander Radon, M.D.      1. Viral syndrome  MDM  URI symptoms for the past 2 weeks. CXR negative.  VSS.  Pt  has inhaler at home and is not a smoker.  Reassurance given. Cough medication prescribed.  F/u with PCP.    BP 166/65  Pulse 88  Temp 98.7 F (37.1 C) (Oral)  Resp 20  SpO2 92%  I have reviewed nursing notes and vital signs. I personally reviewed the imaging tests through PACS system  I reviewed available ER/hospitalization records thought the EMR    Fayrene Helper, [place] 05/12/12 1535

## 2012-05-12 NOTE — ED Notes (Signed)
Pt presents with 2 week h/o productive cough that is yellow and thick.  +shortness of breath, with generalized body aches - pt has felt feverish.

## 2012-09-20 ENCOUNTER — Other Ambulatory Visit: Payer: Self-pay | Admitting: Internal Medicine

## 2012-09-20 DIAGNOSIS — Z1231 Encounter for screening mammogram for malignant neoplasm of breast: Secondary | ICD-10-CM

## 2012-09-29 ENCOUNTER — Ambulatory Visit
Admission: RE | Admit: 2012-09-29 | Discharge: 2012-09-29 | Disposition: A | Discharge status: Home / Self Care | Payer: Medicare HMO | Source: Ambulatory Visit | Attending: Internal Medicine | Admitting: Internal Medicine

## 2012-09-29 DIAGNOSIS — Z1231 Encounter for screening mammogram for malignant neoplasm of breast: Secondary | ICD-10-CM

## 2012-10-23 LAB — HM MAMMOGRAPHY

## 2013-01-02 ENCOUNTER — Ambulatory Visit: Payer: Medicare HMO

## 2013-01-06 ENCOUNTER — Ambulatory Visit: Payer: Medicare HMO | Attending: Orthopaedic Surgery

## 2013-01-06 DIAGNOSIS — R5381 Other malaise: Secondary | ICD-10-CM | POA: Insufficient documentation

## 2013-01-06 DIAGNOSIS — R262 Difficulty in walking, not elsewhere classified: Secondary | ICD-10-CM | POA: Insufficient documentation

## 2013-01-06 DIAGNOSIS — IMO0001 Reserved for inherently not codable concepts without codable children: Secondary | ICD-10-CM | POA: Insufficient documentation

## 2013-01-06 DIAGNOSIS — M25569 Pain in unspecified knee: Secondary | ICD-10-CM | POA: Insufficient documentation

## 2013-01-06 DIAGNOSIS — R5383 Other fatigue: Secondary | ICD-10-CM | POA: Insufficient documentation

## 2013-01-06 DIAGNOSIS — R209 Unspecified disturbances of skin sensation: Secondary | ICD-10-CM | POA: Insufficient documentation

## 2013-01-11 ENCOUNTER — Ambulatory Visit: Payer: Medicare HMO | Admitting: Physical Therapy

## 2013-01-16 ENCOUNTER — Ambulatory Visit: Payer: Medicare HMO | Admitting: Rehabilitation

## 2013-01-18 ENCOUNTER — Ambulatory Visit: Payer: Medicare HMO | Admitting: Rehabilitation

## 2013-01-24 ENCOUNTER — Ambulatory Visit: Payer: Medicare HMO | Attending: Orthopaedic Surgery | Admitting: Physical Therapy

## 2013-01-24 DIAGNOSIS — M25569 Pain in unspecified knee: Secondary | ICD-10-CM | POA: Insufficient documentation

## 2013-01-24 DIAGNOSIS — R262 Difficulty in walking, not elsewhere classified: Secondary | ICD-10-CM | POA: Insufficient documentation

## 2013-01-24 DIAGNOSIS — IMO0001 Reserved for inherently not codable concepts without codable children: Secondary | ICD-10-CM | POA: Insufficient documentation

## 2013-01-24 DIAGNOSIS — R5381 Other malaise: Secondary | ICD-10-CM | POA: Insufficient documentation

## 2013-01-24 DIAGNOSIS — R209 Unspecified disturbances of skin sensation: Secondary | ICD-10-CM | POA: Insufficient documentation

## 2013-01-30 ENCOUNTER — Ambulatory Visit: Payer: Medicare HMO | Admitting: Physical Therapy

## 2013-02-02 ENCOUNTER — Ambulatory Visit: Payer: Medicare HMO | Admitting: Rehabilitation

## 2013-02-06 ENCOUNTER — Ambulatory Visit: Payer: Medicare HMO | Admitting: Physical Therapy

## 2013-02-09 ENCOUNTER — Ambulatory Visit: Payer: Medicare HMO | Admitting: Rehabilitation

## 2013-06-20 ENCOUNTER — Encounter: Payer: Self-pay | Admitting: Adult Health

## 2013-06-20 ENCOUNTER — Ambulatory Visit (INDEPENDENT_AMBULATORY_CARE_PROVIDER_SITE_OTHER): Payer: Medicare HMO | Admitting: Adult Health

## 2013-06-20 VITALS — BP 120/60 | HR 84 | Temp 97.9°F | Resp 12 | Ht 63.25 in | Wt 177.0 lb

## 2013-06-20 DIAGNOSIS — G479 Sleep disorder, unspecified: Secondary | ICD-10-CM

## 2013-06-20 DIAGNOSIS — G35 Multiple sclerosis: Secondary | ICD-10-CM

## 2013-06-20 DIAGNOSIS — F32A Depression, unspecified: Secondary | ICD-10-CM

## 2013-06-20 DIAGNOSIS — D229 Melanocytic nevi, unspecified: Secondary | ICD-10-CM

## 2013-06-20 DIAGNOSIS — F3289 Other specified depressive episodes: Secondary | ICD-10-CM

## 2013-06-20 DIAGNOSIS — F329 Major depressive disorder, single episode, unspecified: Secondary | ICD-10-CM

## 2013-06-20 DIAGNOSIS — B1920 Unspecified viral hepatitis C without hepatic coma: Secondary | ICD-10-CM

## 2013-06-20 DIAGNOSIS — D239 Other benign neoplasm of skin, unspecified: Secondary | ICD-10-CM

## 2013-06-20 DIAGNOSIS — Z1283 Encounter for screening for malignant neoplasm of skin: Secondary | ICD-10-CM

## 2013-06-20 MED ORDER — ESCITALOPRAM OXALATE 10 MG PO TABS: 10.0000 mg | ORAL_TABLET | Freq: Every day | ORAL | Status: DC

## 2013-06-20 MED ORDER — TRAZODONE HCL 50 MG PO TABS: 50.0000 mg | ORAL_TABLET | Freq: Every day | ORAL | Status: DC

## 2013-06-20 NOTE — Progress Notes (Signed)
Pre visit review using our clinic review tool, if applicable. No additional management support is needed unless otherwise documented below in the visit note. 

## 2013-06-20 NOTE — Progress Notes (Signed)
Subjective:    Patient ID: Deborah Reid, female    DOB: [birth year], 63 y.o.   MRN: [id number]  HPI  Pt is a pleasant 63 y/o female who presents to establish care. She was previously living in [place] but has moved to [place]. She is requesting transfer of physicians closer to her home. Hx of Hepatitis C but denies any medications for tx. She was followed by GI in [place]. Also diagnosed with MS approximately 5 years ago at [facility]. Would like a local neurologist. No medications for MS. She reports that physicians told her that they wanted her to get treatment for Hep C before getting any medications for her MS. Also hx of RA. She was followed previously at the [facility] in [place]. Will request records. She has ongoing problems with constipation. Hx of depression and was taking lexapro and trazodone for sleep. She has run out of her medications. She has multiple moles on bil LE that she would like evaluated. She would like to see a dermatologist.    Past Medical History  Diagnosis Date  . Depression   . Hepatitis C   . Multiple sclerosis   . Rheumatoid arthritis   . Asthma   . Glaucoma   . GERD (gastroesophageal reflux disease)      Past Surgical History  Procedure Laterality Date  . Cholecystectomy       Family History  Problem Relation Age of Onset  . Arthritis Mother   . Depression Mother   . Alcohol abuse Father   . Arthritis Father   . Transient ischemic attack Sister   . Cancer Brother     prostate cancer  . Heart disease Maternal Grandmother   . Hypertension Maternal Grandmother   . Diabetes Maternal Grandfather   . Hypertension Paternal Grandmother   . Arthritis Sister   . Lupus Sister   . Hypertension Sister      History   Social History  . Marital Status: Divorced    Spouse Name: N/A    Number of Children: N/A  . Years of Education: N/A   Occupational History  . Not on file.   Social History Main Topics  . Smoking  status: Former Smoker    Types: Cigarettes    Quit date: 06/20/1973  . Smokeless tobacco: Never Used  . Alcohol Use: No  . Drug Use: No  . Sexual Activity: Not on file   Other Topics Concern  . Not on file   Social History Narrative  . No narrative on file     Review of Systems  Constitutional: Negative.   HENT: Negative.   Eyes: Negative.   Respiratory: Negative.   Cardiovascular: Negative.   Endocrine: Negative.   Genitourinary: Negative.   Musculoskeletal: Negative.   Skin: Negative.   Allergic/Immunologic: Negative.   Neurological: Positive for dizziness.       Weakness in bilateral lower extremities. Hx of MS. Uses cane to ambulate. Needs Handicap sticker. Recent dizziness x 1 week. Room spinning.  Hematological: Negative.   Psychiatric/Behavioral: Negative.        Objective:   Physical Exam  Constitutional: She is oriented to person, place, and time. She appears well-developed and well-nourished. No distress.  HENT:  Head: Normocephalic and atraumatic.  Right Ear: External ear normal.  Left Ear: External ear normal.  Eyes: Conjunctivae and EOM are normal. Pupils are equal, round, and reactive to light.  Neck: Normal range of motion. Neck supple. No tracheal deviation present.  Cardiovascular: Normal rate, regular rhythm, normal heart sounds and intact distal pulses.  Exam reveals no gallop and no friction rub.   No murmur heard. Pulmonary/Chest: Effort normal and breath sounds normal. No respiratory distress. She has no wheezes. She has no rales.  Abdominal: Soft. Bowel sounds are normal.  Musculoskeletal: Normal range of motion. She exhibits no edema and no tenderness.  Ambulates with a cane.   Neurological: She is alert and oriented to person, place, and time. She has normal reflexes. No cranial nerve deficit. Coordination abnormal.  Skin: Skin is warm and dry.  Psychiatric: She has a normal mood and affect. Her behavior is normal. Judgment and thought  content normal.    BP 120/60  Pulse 84  Temp(Src) 97.9 F (36.6 C) (Oral)  Resp 12  Ht 5' 3.25" (1.607 m)  Wt 177 lb (80.287 kg)  BMI 31.09 kg/m2  SpO2 98%       Assessment & Plan:

## 2013-06-20 NOTE — Patient Instructions (Signed)
  Start Lexapro 10 mg at bedtime.  Also take trazodone 50 mg at bedtime as needed.  I am referring you to a local neurologist for evaluation of your multiple sclerosis.  I am also referring you to a local gastroenterologist for followup on your hepatitis C.  I am also referring you to a dermatologist for a full skin assessment.  For constipation take MiraLax daily as needed.  Once I complete the handicap sticker application we will contact you.  Please make a follow up appointment in 3 months.

## 2013-06-21 DIAGNOSIS — G479 Sleep disorder, unspecified: Secondary | ICD-10-CM | POA: Insufficient documentation

## 2013-06-21 DIAGNOSIS — F32A Depression, unspecified: Secondary | ICD-10-CM | POA: Insufficient documentation

## 2013-06-21 DIAGNOSIS — D229 Melanocytic nevi, unspecified: Secondary | ICD-10-CM | POA: Insufficient documentation

## 2013-06-21 DIAGNOSIS — F329 Major depressive disorder, single episode, unspecified: Secondary | ICD-10-CM | POA: Insufficient documentation

## 2013-06-21 DIAGNOSIS — Z1283 Encounter for screening for malignant neoplasm of skin: Secondary | ICD-10-CM | POA: Insufficient documentation

## 2013-06-21 NOTE — Assessment & Plan Note (Signed)
Refer to do for full-body screen - multiple moles throughout body.

## 2013-06-21 NOTE — Assessment & Plan Note (Signed)
Obtain medical records. Refer to local GI, Dr. Vira Agar

## 2013-06-21 NOTE — Assessment & Plan Note (Signed)
Resume Lexapro 10 mg at bedtime. Followup 1 month.

## 2013-06-21 NOTE — Assessment & Plan Note (Addendum)
Obtain medical records. Referred to local neurologist, Dr. Brigitte Pulse. Application for handicap sticker completed - status permanent

## 2013-06-21 NOTE — Assessment & Plan Note (Signed)
Resume trazodone 50 mg at bedtime. Followup 1 month

## 2013-08-08 ENCOUNTER — Telehealth: Payer: Self-pay | Admitting: *Deleted

## 2013-08-08 NOTE — Telephone Encounter (Signed)
Pt called stating that pharmacy no longer has her Rx, I called pharmacy and they stated that there is no issues with the Rx's, called pt back and left message for her to call our office back

## 2013-08-20 ENCOUNTER — Encounter: Payer: Self-pay | Admitting: Adult Health

## 2013-09-18 ENCOUNTER — Ambulatory Visit (INDEPENDENT_AMBULATORY_CARE_PROVIDER_SITE_OTHER): Payer: Medicare HMO | Admitting: Adult Health

## 2013-09-18 ENCOUNTER — Encounter: Payer: Self-pay | Admitting: Adult Health

## 2013-09-18 VITALS — BP 122/70 | HR 78 | Temp 98.0°F | Resp 14 | Wt 192.0 lb

## 2013-09-18 DIAGNOSIS — F32A Depression, unspecified: Secondary | ICD-10-CM

## 2013-09-18 DIAGNOSIS — G479 Sleep disorder, unspecified: Secondary | ICD-10-CM

## 2013-09-18 DIAGNOSIS — F3289 Other specified depressive episodes: Secondary | ICD-10-CM

## 2013-09-18 DIAGNOSIS — F329 Major depressive disorder, single episode, unspecified: Secondary | ICD-10-CM

## 2013-09-18 MED ORDER — TRAZODONE HCL 50 MG PO TABS: 50.0000 mg | ORAL_TABLET | Freq: Every day | ORAL | Status: DC

## 2013-09-18 MED ORDER — VITAMIN D 1000 UNITS PO TABS: 1000.0000 [IU] | ORAL_TABLET | Freq: Every day | ORAL | Status: AC

## 2013-09-18 MED ORDER — ESCITALOPRAM OXALATE 10 MG PO TABS: 10.0000 mg | ORAL_TABLET | Freq: Every day | ORAL | Status: DC

## 2013-09-18 NOTE — Progress Notes (Signed)
Pre visit review using our clinic review tool, if applicable. No additional management support is needed unless otherwise documented below in the visit note. 

## 2013-09-18 NOTE — Progress Notes (Signed)
Patient ID: Deborah Reid, female   DOB: 09-03-1950, 63 y.o.   MRN: [id number]    Subjective:    Patient ID: Deborah Reid, female    DOB: 05-Mar-1951, 63 y.o.   MRN: [id number]  HPI  Pt is a pleasant 63 y/o female who presents for follow up depression and sleep disturbance.   1) Depression - pt reports having problems picking up her prescription. She gets her SS check on the first of the month. When she went to pick up medication, the pharmacy had taken it off the shelf and told her that she needed a new prescription. She reports that she called the office but could not get through to anyone. She never started the lexapro or the trazadone for her sleep.  2) Sleep disturbance - Did not get medication. See above. She has been having trouble sleeping. She tries drinking warm milk and lies in bed until she falls asleep.   Past Medical History  Diagnosis Date  . Depression   . Hepatitis C   . Multiple sclerosis   . Rheumatoid arthritis   . Asthma   . Glaucoma   . GERD (gastroesophageal reflux disease)     Current Outpatient Prescriptions on File Prior to Visit  Medication Sig Dispense Refill  . cholecalciferol (VITAMIN D) 1000 UNITS tablet Take 1,000 Units by mouth daily.      Marland Kitchen escitalopram (LEXAPRO) 10 MG tablet Take 1 tablet (10 mg total) by mouth at bedtime.  30 tablet  5  . Multiple Vitamin (MULTIVITAMIN) tablet Take 1 tablet by mouth daily.      . traZODone (DESYREL) 50 MG tablet Take 1 tablet (50 mg total) by mouth at bedtime.  30 tablet  5  . vitamin C (ASCORBIC ACID) 500 MG tablet Take 500 mg by mouth daily.       No current facility-administered medications on file prior to visit.     Review of Systems  Constitutional: Negative.   HENT: Negative.   Eyes: Negative.   Respiratory: Negative.   Cardiovascular: Negative.   Gastrointestinal: Negative.   Endocrine: Negative.   Genitourinary: Negative.   Musculoskeletal: Negative.   Skin: Negative.     Allergic/Immunologic: Negative.   Neurological: Negative.   Hematological: Negative.   Psychiatric/Behavioral: Positive for sleep disturbance. Negative for hallucinations, behavioral problems, confusion and agitation. Nervous/anxious: mild depression.        Objective:  BP 122/70  Pulse 78  Temp(Src) 98 F (36.7 C) (Oral)  Resp 14  Wt 192 lb (87.091 kg)  SpO2 97%   Physical Exam  Constitutional: She is oriented to person, place, and time. No distress.  HENT:  Head: Normocephalic and atraumatic.  Eyes: Conjunctivae and EOM are normal.  Neck: Normal range of motion. Neck supple.  Cardiovascular: Normal rate, regular rhythm, normal heart sounds and intact distal pulses.  Exam reveals no gallop and no friction rub.   No murmur heard. Pulmonary/Chest: Effort normal and breath sounds normal. No respiratory distress. She has no wheezes. She has no rales.  Musculoskeletal:  Ambulates with a cane  Neurological: She is alert and oriented to person, place, and time. She has normal reflexes. Coordination normal.  Skin: Skin is warm and dry.  Psychiatric: She has a normal mood and affect. Her behavior is normal. Judgment and thought content normal.      Assessment & Plan:   1. Depression and sleep disturbance - Resent prescription for both lexapro and trazodone. Advised pt to call the  office if she has any problems getting her medications. She will call in 1 month and report on how she is doing. Pt has trouble getting to the office.

## 2013-12-08 ENCOUNTER — Ambulatory Visit: Payer: Self-pay | Admitting: Family

## 2014-01-23 ENCOUNTER — Encounter: Payer: Self-pay | Admitting: Gastroenterology

## 2014-06-26 ENCOUNTER — Ambulatory Visit: Payer: Self-pay | Admitting: Family

## 2014-06-29 ENCOUNTER — Ambulatory Visit: Payer: Self-pay | Admitting: Family

## 2015-03-01 DIAGNOSIS — M069 Rheumatoid arthritis, unspecified: Secondary | ICD-10-CM | POA: Diagnosis not present

## 2015-03-01 DIAGNOSIS — M25562 Pain in left knee: Secondary | ICD-10-CM | POA: Diagnosis not present

## 2015-03-01 DIAGNOSIS — Z23 Encounter for immunization: Secondary | ICD-10-CM | POA: Diagnosis not present

## 2015-03-01 DIAGNOSIS — J4520 Mild intermittent asthma, uncomplicated: Secondary | ICD-10-CM | POA: Diagnosis not present

## 2015-03-01 DIAGNOSIS — G35 Multiple sclerosis: Secondary | ICD-10-CM | POA: Diagnosis not present

## 2015-03-01 DIAGNOSIS — G629 Polyneuropathy, unspecified: Secondary | ICD-10-CM | POA: Diagnosis not present

## 2015-07-02 DIAGNOSIS — R42 Dizziness and giddiness: Secondary | ICD-10-CM | POA: Diagnosis not present

## 2015-07-02 DIAGNOSIS — G35 Multiple sclerosis: Secondary | ICD-10-CM | POA: Diagnosis not present

## 2015-07-02 DIAGNOSIS — E785 Hyperlipidemia, unspecified: Secondary | ICD-10-CM | POA: Diagnosis not present

## 2015-07-02 DIAGNOSIS — R32 Unspecified urinary incontinence: Secondary | ICD-10-CM | POA: Diagnosis not present

## 2015-07-02 DIAGNOSIS — Z23 Encounter for immunization: Secondary | ICD-10-CM | POA: Diagnosis not present

## 2015-07-02 DIAGNOSIS — R296 Repeated falls: Secondary | ICD-10-CM | POA: Diagnosis not present

## 2015-07-02 DIAGNOSIS — G629 Polyneuropathy, unspecified: Secondary | ICD-10-CM | POA: Diagnosis not present

## 2015-07-02 DIAGNOSIS — J4520 Mild intermittent asthma, uncomplicated: Secondary | ICD-10-CM | POA: Diagnosis not present

## 2015-07-02 DIAGNOSIS — Z79899 Other long term (current) drug therapy: Secondary | ICD-10-CM | POA: Diagnosis not present

## 2015-07-02 DIAGNOSIS — K219 Gastro-esophageal reflux disease without esophagitis: Secondary | ICD-10-CM | POA: Diagnosis not present

## 2015-07-02 DIAGNOSIS — M069 Rheumatoid arthritis, unspecified: Secondary | ICD-10-CM | POA: Diagnosis not present

## 2015-09-09 DIAGNOSIS — B351 Tinea unguium: Secondary | ICD-10-CM | POA: Diagnosis not present

## 2015-09-09 DIAGNOSIS — R51 Headache: Secondary | ICD-10-CM | POA: Diagnosis not present

## 2015-09-09 DIAGNOSIS — M069 Rheumatoid arthritis, unspecified: Secondary | ICD-10-CM | POA: Diagnosis not present

## 2015-09-09 DIAGNOSIS — M25511 Pain in right shoulder: Secondary | ICD-10-CM | POA: Diagnosis not present

## 2015-09-09 DIAGNOSIS — R197 Diarrhea, unspecified: Secondary | ICD-10-CM | POA: Diagnosis not present

## 2015-10-18 DIAGNOSIS — G35 Multiple sclerosis: Secondary | ICD-10-CM | POA: Diagnosis not present

## 2015-10-18 DIAGNOSIS — M2550 Pain in unspecified joint: Secondary | ICD-10-CM | POA: Diagnosis not present

## 2015-10-18 DIAGNOSIS — M0579 Rheumatoid arthritis with rheumatoid factor of multiple sites without organ or systems involvement: Secondary | ICD-10-CM | POA: Diagnosis not present

## 2015-10-18 DIAGNOSIS — R5383 Other fatigue: Secondary | ICD-10-CM | POA: Diagnosis not present

## 2015-11-07 ENCOUNTER — Other Ambulatory Visit: Payer: Self-pay | Admitting: Family Medicine

## 2015-11-07 DIAGNOSIS — Z1231 Encounter for screening mammogram for malignant neoplasm of breast: Secondary | ICD-10-CM

## 2015-11-07 DIAGNOSIS — M0579 Rheumatoid arthritis with rheumatoid factor of multiple sites without organ or systems involvement: Secondary | ICD-10-CM | POA: Diagnosis not present

## 2015-11-07 DIAGNOSIS — M2550 Pain in unspecified joint: Secondary | ICD-10-CM | POA: Diagnosis not present

## 2015-11-07 DIAGNOSIS — G35 Multiple sclerosis: Secondary | ICD-10-CM | POA: Diagnosis not present

## 2015-11-07 DIAGNOSIS — R5383 Other fatigue: Secondary | ICD-10-CM | POA: Diagnosis not present

## 2015-11-07 DIAGNOSIS — M79641 Pain in right hand: Secondary | ICD-10-CM | POA: Diagnosis not present

## 2015-12-02 DIAGNOSIS — J4520 Mild intermittent asthma, uncomplicated: Secondary | ICD-10-CM | POA: Diagnosis not present

## 2015-12-02 DIAGNOSIS — R42 Dizziness and giddiness: Secondary | ICD-10-CM | POA: Diagnosis not present

## 2015-12-02 DIAGNOSIS — G35 Multiple sclerosis: Secondary | ICD-10-CM | POA: Diagnosis not present

## 2015-12-02 DIAGNOSIS — R7301 Impaired fasting glucose: Secondary | ICD-10-CM | POA: Diagnosis not present

## 2015-12-02 DIAGNOSIS — R319 Hematuria, unspecified: Secondary | ICD-10-CM | POA: Diagnosis not present

## 2015-12-02 DIAGNOSIS — M069 Rheumatoid arthritis, unspecified: Secondary | ICD-10-CM | POA: Diagnosis not present

## 2015-12-02 DIAGNOSIS — E785 Hyperlipidemia, unspecified: Secondary | ICD-10-CM | POA: Diagnosis not present

## 2015-12-02 DIAGNOSIS — R8299 Other abnormal findings in urine: Secondary | ICD-10-CM | POA: Diagnosis not present

## 2015-12-02 DIAGNOSIS — Z1239 Encounter for other screening for malignant neoplasm of breast: Secondary | ICD-10-CM | POA: Diagnosis not present

## 2015-12-02 DIAGNOSIS — R32 Unspecified urinary incontinence: Secondary | ICD-10-CM | POA: Diagnosis not present

## 2016-04-23 DIAGNOSIS — Z0120 Encounter for dental examination and cleaning without abnormal findings: Secondary | ICD-10-CM | POA: Diagnosis not present

## 2016-04-23 DIAGNOSIS — K219 Gastro-esophageal reflux disease without esophagitis: Secondary | ICD-10-CM | POA: Diagnosis not present

## 2016-04-23 DIAGNOSIS — G35 Multiple sclerosis: Secondary | ICD-10-CM | POA: Diagnosis not present

## 2016-04-23 DIAGNOSIS — F329 Major depressive disorder, single episode, unspecified: Secondary | ICD-10-CM | POA: Diagnosis not present

## 2016-04-23 DIAGNOSIS — N390 Urinary tract infection, site not specified: Secondary | ICD-10-CM | POA: Diagnosis not present

## 2016-06-19 DIAGNOSIS — J019 Acute sinusitis, unspecified: Secondary | ICD-10-CM | POA: Diagnosis not present

## 2016-06-19 DIAGNOSIS — Z13220 Encounter for screening for lipoid disorders: Secondary | ICD-10-CM | POA: Diagnosis not present

## 2016-06-19 DIAGNOSIS — J4520 Mild intermittent asthma, uncomplicated: Secondary | ICD-10-CM | POA: Diagnosis not present

## 2016-06-19 DIAGNOSIS — Z131 Encounter for screening for diabetes mellitus: Secondary | ICD-10-CM | POA: Diagnosis not present

## 2016-06-19 DIAGNOSIS — J302 Other seasonal allergic rhinitis: Secondary | ICD-10-CM | POA: Diagnosis not present

## 2016-06-19 DIAGNOSIS — G35 Multiple sclerosis: Secondary | ICD-10-CM | POA: Diagnosis not present

## 2016-07-03 DIAGNOSIS — M1712 Unilateral primary osteoarthritis, left knee: Secondary | ICD-10-CM | POA: Diagnosis not present

## 2016-07-03 DIAGNOSIS — M532X7 Spinal instabilities, lumbosacral region: Secondary | ICD-10-CM | POA: Diagnosis not present

## 2016-07-03 DIAGNOSIS — M1711 Unilateral primary osteoarthritis, right knee: Secondary | ICD-10-CM | POA: Diagnosis not present

## 2016-11-30 DIAGNOSIS — J4520 Mild intermittent asthma, uncomplicated: Secondary | ICD-10-CM | POA: Diagnosis not present

## 2016-11-30 DIAGNOSIS — B1710 Acute hepatitis C without hepatic coma: Secondary | ICD-10-CM | POA: Diagnosis not present

## 2016-11-30 DIAGNOSIS — K219 Gastro-esophageal reflux disease without esophagitis: Secondary | ICD-10-CM | POA: Diagnosis not present

## 2016-11-30 DIAGNOSIS — G35 Multiple sclerosis: Secondary | ICD-10-CM | POA: Diagnosis not present

## 2016-11-30 DIAGNOSIS — K30 Functional dyspepsia: Secondary | ICD-10-CM | POA: Diagnosis not present

## 2016-11-30 DIAGNOSIS — R030 Elevated blood-pressure reading, without diagnosis of hypertension: Secondary | ICD-10-CM | POA: Diagnosis not present

## 2016-12-28 DIAGNOSIS — E2839 Other primary ovarian failure: Secondary | ICD-10-CM | POA: Diagnosis not present

## 2016-12-28 DIAGNOSIS — G35 Multiple sclerosis: Secondary | ICD-10-CM | POA: Diagnosis not present

## 2016-12-28 DIAGNOSIS — K219 Gastro-esophageal reflux disease without esophagitis: Secondary | ICD-10-CM | POA: Diagnosis not present

## 2016-12-28 DIAGNOSIS — M5414 Radiculopathy, thoracic region: Secondary | ICD-10-CM | POA: Diagnosis not present

## 2016-12-28 DIAGNOSIS — J4520 Mild intermittent asthma, uncomplicated: Secondary | ICD-10-CM | POA: Diagnosis not present

## 2016-12-28 DIAGNOSIS — Z1239 Encounter for other screening for malignant neoplasm of breast: Secondary | ICD-10-CM | POA: Diagnosis not present

## 2016-12-31 DIAGNOSIS — R1013 Epigastric pain: Secondary | ICD-10-CM | POA: Diagnosis not present

## 2017-02-22 DIAGNOSIS — F329 Major depressive disorder, single episode, unspecified: Secondary | ICD-10-CM | POA: Diagnosis not present

## 2017-02-22 DIAGNOSIS — J4520 Mild intermittent asthma, uncomplicated: Secondary | ICD-10-CM | POA: Diagnosis not present

## 2017-02-22 DIAGNOSIS — G35 Multiple sclerosis: Secondary | ICD-10-CM | POA: Diagnosis not present

## 2017-02-22 DIAGNOSIS — Z23 Encounter for immunization: Secondary | ICD-10-CM | POA: Diagnosis not present

## 2017-02-22 DIAGNOSIS — K219 Gastro-esophageal reflux disease without esophagitis: Secondary | ICD-10-CM | POA: Diagnosis not present

## 2017-02-22 DIAGNOSIS — J302 Other seasonal allergic rhinitis: Secondary | ICD-10-CM | POA: Diagnosis not present

## 2017-06-08 ENCOUNTER — Other Ambulatory Visit: Payer: Self-pay | Admitting: Internal Medicine

## 2017-06-08 DIAGNOSIS — E2839 Other primary ovarian failure: Secondary | ICD-10-CM

## 2017-06-08 DIAGNOSIS — Z1231 Encounter for screening mammogram for malignant neoplasm of breast: Secondary | ICD-10-CM

## 2017-06-29 ENCOUNTER — Inpatient Hospital Stay
Admission: RE | Admit: 2017-06-29 | Discharge: 2017-06-29 | Disposition: A | Discharge status: Home / Self Care | Payer: Medicare HMO | Source: Ambulatory Visit | Attending: Internal Medicine | Admitting: Internal Medicine

## 2017-06-29 ENCOUNTER — Ambulatory Visit: Payer: Medicare HMO

## 2017-10-23 ENCOUNTER — Other Ambulatory Visit: Payer: Self-pay

## 2017-10-23 ENCOUNTER — Emergency Department (HOSPITAL_COMMUNITY): Payer: Medicare HMO

## 2017-10-23 ENCOUNTER — Encounter (HOSPITAL_COMMUNITY): Payer: Self-pay

## 2017-10-23 ENCOUNTER — Emergency Department (HOSPITAL_COMMUNITY)
Admission: EM | Admit: 2017-10-23 | Discharge: 2017-10-23 | Disposition: A | Discharge status: Home / Self Care | Payer: Medicare HMO | Attending: Emergency Medicine | Admitting: Emergency Medicine

## 2017-10-23 DIAGNOSIS — Y9389 Activity, other specified: Secondary | ICD-10-CM | POA: Diagnosis not present

## 2017-10-23 DIAGNOSIS — S134XXA Sprain of ligaments of cervical spine, initial encounter: Secondary | ICD-10-CM | POA: Diagnosis not present

## 2017-10-23 DIAGNOSIS — Y929 Unspecified place or not applicable: Secondary | ICD-10-CM | POA: Insufficient documentation

## 2017-10-23 DIAGNOSIS — S139XXA Sprain of joints and ligaments of unspecified parts of neck, initial encounter: Secondary | ICD-10-CM | POA: Insufficient documentation

## 2017-10-23 DIAGNOSIS — Z87891 Personal history of nicotine dependence: Secondary | ICD-10-CM | POA: Diagnosis not present

## 2017-10-23 DIAGNOSIS — S0083XA Contusion of other part of head, initial encounter: Secondary | ICD-10-CM | POA: Diagnosis not present

## 2017-10-23 DIAGNOSIS — S0990XA Unspecified injury of head, initial encounter: Secondary | ICD-10-CM | POA: Diagnosis not present

## 2017-10-23 DIAGNOSIS — J45909 Unspecified asthma, uncomplicated: Secondary | ICD-10-CM | POA: Diagnosis not present

## 2017-10-23 DIAGNOSIS — Z79899 Other long term (current) drug therapy: Secondary | ICD-10-CM | POA: Insufficient documentation

## 2017-10-23 DIAGNOSIS — M542 Cervicalgia: Secondary | ICD-10-CM | POA: Diagnosis not present

## 2017-10-23 DIAGNOSIS — Y999 Unspecified external cause status: Secondary | ICD-10-CM | POA: Diagnosis not present

## 2017-10-23 DIAGNOSIS — R51 Headache: Secondary | ICD-10-CM | POA: Diagnosis not present

## 2017-10-23 DIAGNOSIS — S8001XA Contusion of right knee, initial encounter: Secondary | ICD-10-CM | POA: Diagnosis not present

## 2017-10-23 DIAGNOSIS — W19XXXA Unspecified fall, initial encounter: Secondary | ICD-10-CM

## 2017-10-23 DIAGNOSIS — S199XXA Unspecified injury of neck, initial encounter: Secondary | ICD-10-CM | POA: Diagnosis not present

## 2017-10-23 DIAGNOSIS — W228XXA Striking against or struck by other objects, initial encounter: Secondary | ICD-10-CM | POA: Insufficient documentation

## 2017-10-23 MED ORDER — ACETAMINOPHEN 325 MG PO TABS
650.0000 mg | ORAL_TABLET | Freq: Once | ORAL | Status: AC | PRN
  Administered 2017-10-23: 650 mg via ORAL
  Filled 2017-10-23: qty 2

## 2017-10-23 MED ORDER — METHOCARBAMOL 500 MG PO TABS: 500.0000 mg | ORAL_TABLET | Freq: Two times a day (BID) | ORAL | 0 refills | Status: DC

## 2017-10-23 NOTE — ED Provider Notes (Signed)
[facility]ENCY DEPART[facility] Provider Note   CSN: [id number] Arrival date & time: 10/23/17  1726     History   Chief Complaint Chief Complaint  Patient presents with  . Fall    HPI Deborah Reid is a 67 y.o. female.  HPI SHANESE RIEMENSCHNEIDER is a 67 y.o. female with history of multiple sclerosis, rheumatoid arthritis, hepatitis C, asthma, depression, presents to emergency department after a fall.  Patient states she leaned over to kill a spider when she fell forward and hit her ahead on the ground.  No loss of consciousness.  She was able to get herself up.  She states that she also had bilateral knees but able to walk on them.  Reports severe headaches since the fall which occurred this morning.  Reports neck pain.  No dizziness, no nausea or vomiting.  No numbness or weakness in extremities.  No other injuries.  She is not anticoagulated.  She reports headache is severe, one of the worst headache she is had.  Past Medical History:  Diagnosis Date  . Asthma   . Depression   . GERD (gastroesophageal reflux disease)   . Glaucoma   . Hepatitis C   . Multiple sclerosis ([place])   . Rheumatoid arthritis [facility])     Patient Active Problem List   Diagnosis Date Noted  . Depression 06/21/2013  . Sleep disturbance 06/21/2013  . Numerous moles 06/21/2013  . Screening for skin cancer 06/21/2013  . HEPATITIS C, CHRONIC 07/01/2009  . MULTIPLE SCLEROSIS 07/01/2009  . ESOPHAGEAL REFLUX 07/01/2009  . DYSPHAGIA UNSPECIFIED 07/01/2009    Past Surgical History:  Procedure Laterality Date  . CHOLECYSTECTOMY       OB History   None      Home Medications    Prior to Admission medications   Medication Sig Start Date End Date Taking? Authorizing Provider  cholecalciferol (VITAMIN D) 1000 UNITS tablet Take 1 tablet (1,000 Units total) by mouth daily. 09/18/13   Rey, Latina Craver, NP  escitalopram (LEXAPRO) 10 MG tablet Take 1 tablet (10 mg total) by mouth at bedtime.  09/18/13   Rey, Latina Craver, NP  Multiple Vitamin (MULTIVITAMIN) tablet Take 1 tablet by mouth daily.    [provider]  traZODone (DESYREL) 50 MG tablet Take 1 tablet (50 mg total) by mouth at bedtime. 09/18/13   Rey, Latina Craver, NP  vitamin C (ASCORBIC ACID) 500 MG tablet Take 500 mg by mouth daily.    [provider]    Family History Family History  Problem Relation Age of Onset  . Arthritis Mother   . Depression Mother   . Alcohol abuse Father   . Arthritis Father   . Transient ischemic attack Sister   . Cancer Brother        prostate cancer  . Arthritis Sister   . Lupus Sister   . Hypertension Sister   . Heart disease Maternal Grandmother   . Hypertension Maternal Grandmother   . Diabetes Maternal Grandfather   . Hypertension Paternal Grandmother     Social History Social History   Tobacco Use  . Smoking status: Former Smoker    Types: Cigarettes    Last attempt to quit: 06/20/1973    Years since quitting: 44.3  . Smokeless tobacco: Never Used  Substance Use Topics  . Alcohol use: No  . Drug use: No     Allergies   Patient has no known allergies.   Review of Systems Review  of Systems  Constitutional: Negative for chills and fever.  Respiratory: Negative for cough, chest tightness and shortness of breath.   Cardiovascular: Negative for chest pain, palpitations and leg swelling.  Gastrointestinal: Negative for abdominal pain, diarrhea, nausea and vomiting.  Genitourinary: Negative for dysuria, flank pain, pelvic pain, vaginal bleeding, vaginal discharge and vaginal pain.  Musculoskeletal: Positive for arthralgias, myalgias and neck pain. Negative for neck stiffness.  Skin: Negative for rash.  Neurological: Positive for headaches. Negative for dizziness and weakness.  All other systems reviewed and are negative.    Physical Exam Updated Vital Signs BP 122/65 (BP Location: Right Arm)   Pulse 75   Temp 98.3 F (36.8 C) (Oral)   Resp 18   Ht  5\' 2"  (1.575 m)   Wt 81.6 kg (180 lb)   SpO2 98%   BMI 32.92 kg/m    Physical Exam  Constitutional: She is oriented to person, place, and time. She appears well-developed and well-nourished. No distress.  HENT:  Head: Normocephalic.  Hematoma to forehead  Eyes: Pupils are equal, round, and reactive to light. Conjunctivae and EOM are normal.  Neck: Neck supple.  Midline tenderness. ttp over left trapezius muscle.   Cardiovascular: Normal rate, regular rhythm and normal heart sounds.  Pulmonary/Chest: Effort normal and breath sounds normal. No respiratory distress. She has no wheezes. She has no rales.  Abdominal: Soft. Bowel sounds are normal. She exhibits no distension. There is no tenderness. There is no rebound.  Musculoskeletal: She exhibits no edema.  Small contusion to right anterior knee. Pain with ROM of bilateral knees, full ROM  Neurological: She is alert and oriented to person, place, and time.  5/5 and equal upper and lower extremity strength bilaterally. Equal grip strength bilaterally. Normal finger to nose and heel to shin. No pronator drift.  Skin: Skin is warm and dry.  Psychiatric: She has a normal mood and affect. Her behavior is normal.  Nursing note and vitals reviewed.    ED Treatments / Results  Labs (all labs ordered are listed, but only abnormal results are displayed) Labs Reviewed - No data to display  EKG None  Radiology Ct Head Wo Contrast  Result Date: 10/23/2017 CLINICAL DATA:  67 year old female with acute head and neck injury following fall today, with headache and neck pain. EXAM: CT HEAD WITHOUT CONTRAST CT CERVICAL SPINE WITHOUT CONTRAST TECHNIQUE: Multidetector CT imaging of the head and cervical spine was performed following the standard protocol without intravenous contrast. Multiplanar CT image reconstructions of the cervical spine were also generated. COMPARISON:  10/11/2009 MR and CT FINDINGS: CT HEAD FINDINGS Brain: No evidence of acute  infarction, hemorrhage, hydrocephalus, extra-axial collection or mass lesion/mass effect. Mild periventricular white matter hypodensities/chronic small-vessel white matter ischemic changes again noted. Vascular: Atherosclerotic calcifications again noted. Skull: Normal. Negative for fracture or focal lesion. Sinuses/Orbits: No acute finding. Other: None. CT CERVICAL SPINE FINDINGS Alignment: Normal. Skull base and vertebrae: No acute fracture. No primary bone lesion or focal pathologic process. Soft tissues and spinal canal: No prevertebral fluid or swelling. No visible canal hematoma. Disc levels: Mild degenerative disc disease/spondylosis from C4-C7 again noted. Upper chest: No acute abnormality Other: None IMPRESSION: 1. No evidence of acute intracranial abnormality. Chronic white matter hypodensities/small-vessel ischemic changes. 2. No static evidence of acute injury to the cervical spine. Mild multilevel degenerative changes as described. Electronically Signed   By: Margarette Canada M.D.   On: 10/23/2017 21:13   Ct Cervical Spine Wo Contrast  Result Date:  10/23/2017 CLINICAL DATA:  67 year old female with acute head and neck injury following fall today, with headache and neck pain. EXAM: CT HEAD WITHOUT CONTRAST CT CERVICAL SPINE WITHOUT CONTRAST TECHNIQUE: Multidetector CT imaging of the head and cervical spine was performed following the standard protocol without intravenous contrast. Multiplanar CT image reconstructions of the cervical spine were also generated. COMPARISON:  10/11/2009 MR and CT FINDINGS: CT HEAD FINDINGS Brain: No evidence of acute infarction, hemorrhage, hydrocephalus, extra-axial collection or mass lesion/mass effect. Mild periventricular white matter hypodensities/chronic small-vessel white matter ischemic changes again noted. Vascular: Atherosclerotic calcifications again noted. Skull: Normal. Negative for fracture or focal lesion. Sinuses/Orbits: No acute finding. Other: None. CT  CERVICAL SPINE FINDINGS Alignment: Normal. Skull base and vertebrae: No acute fracture. No primary bone lesion or focal pathologic process. Soft tissues and spinal canal: No prevertebral fluid or swelling. No visible canal hematoma. Disc levels: Mild degenerative disc disease/spondylosis from C4-C7 again noted. Upper chest: No acute abnormality Other: None IMPRESSION: 1. No evidence of acute intracranial abnormality. Chronic white matter hypodensities/small-vessel ischemic changes. 2. No static evidence of acute injury to the cervical spine. Mild multilevel degenerative changes as described. Electronically Signed   By: Margarette Canada M.D.   On: 10/23/2017 21:13    Procedures Procedures (including critical care time)  Medications Ordered in ED Medications  acetaminophen (TYLENOL) tablet 650 mg (650 mg Oral Given 10/23/17 1845)     Initial Impression / Assessment and Plan / ED Course  I have reviewed the triage vital signs and the nursing notes.  Pertinent labs & imaging results that were available during my care of the patient were reviewed by me and considered in my medical decision making (see chart for details).     Pt in ED with a fall earlier today. Hit head on cement. Severe pain to head and neck. No neuro deficits.  Given the severity of the headache and hematoma to the forehead, as well as midline cervical spine tenderness, will get CT scans for further evaluation.   I do not think her knees need any imaging at this time.  9:33 PM CT head and cervical spine with no acute findings. Discussed results with pt. Plan to dc home with tylenol, robaxin, follow up with pcp.Return precautions discussed.    Vitals:   10/23/17 1825 10/23/17 1838  BP: 122/65   Pulse: 75   Resp: 18   Temp: 98.3 F (36.8 C)   TempSrc: Oral   SpO2: 98%   Weight:  81.6 kg (180 lb)  Height:  5\' 2"  (1.575 m)     Final Clinical Impressions(s) / ED Diagnoses   Final diagnoses:  Fall, initial encounter  Injury  of head, initial encounter  Contusion of face, initial encounter  Neck sprain, initial encounter  Contusion of right knee, initial encounter    ED Discharge Orders        Ordered    methocarbamol (ROBAXIN) 500 MG tablet  2 times daily     10/23/17 2135      Jeannett Senior, PA-C 10/23/17 [place], [place], DO 10/24/17 0013

## 2017-10-23 NOTE — ED Triage Notes (Signed)
Pt states that she was trying to kill a spider and leaned forward, from there she went down on her knees, hit her head, nad fell on her left side. Pt has hematoma to mid forehead, denies LOC.

## 2017-10-23 NOTE — ED Notes (Signed)
Pt verbalized understanding discharge instructions and denies any further needs or questions at this time. VS stable, ambulatory and steady gait.   

## 2017-10-23 NOTE — Discharge Instructions (Signed)
Your scans did not show any signs of major injuries on your brain on your neck. Take tylenol or motrin for pain. Robaxin for spasms. Try heating pads. Stretches. Ice and elevate your knees. Follow up with family doctor as needed

## 2017-11-12 ENCOUNTER — Emergency Department (HOSPITAL_COMMUNITY): Payer: Medicare HMO

## 2017-11-12 ENCOUNTER — Encounter (HOSPITAL_COMMUNITY): Payer: Self-pay | Admitting: Emergency Medicine

## 2017-11-12 ENCOUNTER — Other Ambulatory Visit: Payer: Self-pay

## 2017-11-12 ENCOUNTER — Observation Stay (HOSPITAL_BASED_OUTPATIENT_CLINIC_OR_DEPARTMENT_OTHER): Payer: Medicare HMO

## 2017-11-12 ENCOUNTER — Observation Stay (HOSPITAL_COMMUNITY)
Admission: EM | Admit: 2017-11-12 | Discharge: 2017-11-15 | Disposition: A | Discharge status: Home / Self Care | Payer: Medicare HMO | Attending: Internal Medicine | Admitting: Internal Medicine

## 2017-11-12 ENCOUNTER — Observation Stay (HOSPITAL_COMMUNITY): Payer: Medicare HMO

## 2017-11-12 DIAGNOSIS — I10 Essential (primary) hypertension: Secondary | ICD-10-CM | POA: Diagnosis not present

## 2017-11-12 DIAGNOSIS — K219 Gastro-esophageal reflux disease without esophagitis: Secondary | ICD-10-CM | POA: Diagnosis not present

## 2017-11-12 DIAGNOSIS — S0990XA Unspecified injury of head, initial encounter: Secondary | ICD-10-CM | POA: Diagnosis not present

## 2017-11-12 DIAGNOSIS — Z7982 Long term (current) use of aspirin: Secondary | ICD-10-CM | POA: Diagnosis not present

## 2017-11-12 DIAGNOSIS — G35 Multiple sclerosis: Secondary | ICD-10-CM | POA: Insufficient documentation

## 2017-11-12 DIAGNOSIS — G479 Sleep disorder, unspecified: Secondary | ICD-10-CM | POA: Diagnosis not present

## 2017-11-12 DIAGNOSIS — Z9049 Acquired absence of other specified parts of digestive tract: Secondary | ICD-10-CM | POA: Insufficient documentation

## 2017-11-12 DIAGNOSIS — M50321 Other cervical disc degeneration at C4-C5 level: Secondary | ICD-10-CM | POA: Insufficient documentation

## 2017-11-12 DIAGNOSIS — Z811 Family history of alcohol abuse and dependence: Secondary | ICD-10-CM | POA: Diagnosis not present

## 2017-11-12 DIAGNOSIS — F329 Major depressive disorder, single episode, unspecified: Secondary | ICD-10-CM | POA: Diagnosis not present

## 2017-11-12 DIAGNOSIS — Z8669 Personal history of other diseases of the nervous system and sense organs: Secondary | ICD-10-CM | POA: Diagnosis not present

## 2017-11-12 DIAGNOSIS — R9431 Abnormal electrocardiogram [ECG] [EKG]: Secondary | ICD-10-CM | POA: Diagnosis not present

## 2017-11-12 DIAGNOSIS — Z79899 Other long term (current) drug therapy: Secondary | ICD-10-CM | POA: Insufficient documentation

## 2017-11-12 DIAGNOSIS — Z8249 Family history of ischemic heart disease and other diseases of the circulatory system: Secondary | ICD-10-CM | POA: Diagnosis not present

## 2017-11-12 DIAGNOSIS — Z833 Family history of diabetes mellitus: Secondary | ICD-10-CM | POA: Diagnosis not present

## 2017-11-12 DIAGNOSIS — R4182 Altered mental status, unspecified: Secondary | ICD-10-CM | POA: Diagnosis not present

## 2017-11-12 DIAGNOSIS — B182 Chronic viral hepatitis C: Secondary | ICD-10-CM | POA: Diagnosis not present

## 2017-11-12 DIAGNOSIS — R55 Syncope and collapse: Principal | ICD-10-CM | POA: Insufficient documentation

## 2017-11-12 DIAGNOSIS — F32A Depression, unspecified: Secondary | ICD-10-CM | POA: Diagnosis present

## 2017-11-12 DIAGNOSIS — Z8261 Family history of arthritis: Secondary | ICD-10-CM | POA: Insufficient documentation

## 2017-11-12 DIAGNOSIS — I739 Peripheral vascular disease, unspecified: Secondary | ICD-10-CM | POA: Insufficient documentation

## 2017-11-12 DIAGNOSIS — Z6833 Body mass index (BMI) 33.0-33.9, adult: Secondary | ICD-10-CM | POA: Diagnosis not present

## 2017-11-12 DIAGNOSIS — I5030 Unspecified diastolic (congestive) heart failure: Secondary | ICD-10-CM

## 2017-11-12 DIAGNOSIS — H409 Unspecified glaucoma: Secondary | ICD-10-CM | POA: Diagnosis not present

## 2017-11-12 DIAGNOSIS — W19XXXA Unspecified fall, initial encounter: Secondary | ICD-10-CM | POA: Diagnosis not present

## 2017-11-12 DIAGNOSIS — E669 Obesity, unspecified: Secondary | ICD-10-CM | POA: Insufficient documentation

## 2017-11-12 DIAGNOSIS — Z87891 Personal history of nicotine dependence: Secondary | ICD-10-CM | POA: Insufficient documentation

## 2017-11-12 DIAGNOSIS — M069 Rheumatoid arthritis, unspecified: Secondary | ICD-10-CM | POA: Insufficient documentation

## 2017-11-12 DIAGNOSIS — N179 Acute kidney failure, unspecified: Secondary | ICD-10-CM | POA: Diagnosis not present

## 2017-11-12 DIAGNOSIS — Z8489 Family history of other specified conditions: Secondary | ICD-10-CM | POA: Insufficient documentation

## 2017-11-12 DIAGNOSIS — Z818 Family history of other mental and behavioral disorders: Secondary | ICD-10-CM | POA: Insufficient documentation

## 2017-11-12 DIAGNOSIS — Z8042 Family history of malignant neoplasm of prostate: Secondary | ICD-10-CM | POA: Insufficient documentation

## 2017-11-12 DIAGNOSIS — J45909 Unspecified asthma, uncomplicated: Secondary | ICD-10-CM | POA: Insufficient documentation

## 2017-11-12 DIAGNOSIS — R402441 Other coma, without documented Glasgow coma scale score, or with partial score reported, in the field [EMT or ambulance]: Secondary | ICD-10-CM | POA: Diagnosis not present

## 2017-11-12 DIAGNOSIS — B1920 Unspecified viral hepatitis C without hepatic coma: Secondary | ICD-10-CM | POA: Diagnosis present

## 2017-11-12 LAB — URINALYSIS, ROUTINE W REFLEX MICROSCOPIC
BACTERIA UA: NONE SEEN
BILIRUBIN URINE: NEGATIVE
Glucose, UA: NEGATIVE mg/dL
Ketones, ur: NEGATIVE mg/dL
NITRITE: NEGATIVE
Protein, ur: NEGATIVE mg/dL
Specific Gravity, Urine: 1.026 (ref 1.005–1.030)
pH: 7 (ref 5.0–8.0)

## 2017-11-12 LAB — I-STAT TROPONIN, ED: Troponin i, poc: 0.01 ng/mL (ref 0.00–0.08)

## 2017-11-12 LAB — APTT: APTT: 27 s (ref 24–36)

## 2017-11-12 LAB — CBC
HEMATOCRIT: 42.7 % (ref 36.0–46.0)
Hemoglobin: 13.1 g/dL (ref 12.0–15.0)
MCH: 27.1 pg (ref 26.0–34.0)
MCHC: 30.7 g/dL (ref 30.0–36.0)
MCV: 88.2 fL (ref 78.0–100.0)
PLATELETS: 207 10*3/uL (ref 150–400)
RBC: 4.84 MIL/uL (ref 3.87–5.11)
RDW: 12.9 % (ref 11.5–15.5)
WBC: 5.6 10*3/uL (ref 4.0–10.5)

## 2017-11-12 LAB — DIFFERENTIAL
Abs Immature Granulocytes: 0 10*3/uL (ref 0.0–0.1)
BASOS ABS: 0.1 10*3/uL (ref 0.0–0.1)
Basophils Relative: 1 %
EOS ABS: 0.1 10*3/uL (ref 0.0–0.7)
EOS PCT: 1 %
IMMATURE GRANULOCYTES: 0 %
LYMPHS PCT: 45 %
Lymphs Abs: 2.5 10*3/uL (ref 0.7–4.0)
Monocytes Absolute: 0.6 10*3/uL (ref 0.1–1.0)
Monocytes Relative: 11 %
Neutro Abs: 2.4 10*3/uL (ref 1.7–7.7)
Neutrophils Relative %: 42 %

## 2017-11-12 LAB — COMPREHENSIVE METABOLIC PANEL
ALK PHOS: 78 U/L (ref 38–126)
ALT: 24 U/L (ref 14–54)
ANION GAP: 8 (ref 5–15)
AST: 29 U/L (ref 15–41)
Albumin: 3.8 g/dL (ref 3.5–5.0)
BUN: 13 mg/dL (ref 6–20)
CALCIUM: 9.7 mg/dL (ref 8.9–10.3)
CO2: 25 mmol/L (ref 22–32)
Chloride: 108 mmol/L (ref 101–111)
Creatinine, Ser: 1.12 mg/dL — ABNORMAL HIGH (ref 0.44–1.00)
GFR calc Af Amer: 58 mL/min — ABNORMAL LOW (ref >60)
GFR calc non Af Amer: 50 mL/min — ABNORMAL LOW (ref >60)
Glucose, Bld: 101 mg/dL — ABNORMAL HIGH (ref 65–99)
Potassium: 4 mmol/L (ref 3.5–5.1)
SODIUM: 141 mmol/L (ref 135–145)
TOTAL PROTEIN: 7.6 g/dL (ref 6.5–8.1)
Total Bilirubin: 0.8 mg/dL (ref 0.3–1.2)

## 2017-11-12 LAB — PROTIME-INR
INR: 1.04
PROTHROMBIN TIME: 13.5 s (ref 11.4–15.2)

## 2017-11-12 LAB — I-STAT CHEM 8, ED
BUN: 16 mg/dL (ref 6–20)
CHLORIDE: 106 mmol/L (ref 101–111)
Calcium, Ion: 1.21 mmol/L (ref 1.15–1.40)
Creatinine, Ser: 1 mg/dL (ref 0.44–1.00)
Glucose, Bld: 98 mg/dL (ref 65–99)
HEMATOCRIT: 42 % (ref 36.0–46.0)
HEMOGLOBIN: 14.3 g/dL (ref 12.0–15.0)
POTASSIUM: 4 mmol/L (ref 3.5–5.1)
Sodium: 141 mmol/L (ref 135–145)
TCO2: 25 mmol/L (ref 22–32)

## 2017-11-12 LAB — ECHOCARDIOGRAM COMPLETE
Height: 62 in
WEIGHTICAEL: 3200 [oz_av]

## 2017-11-12 LAB — ETHANOL

## 2017-11-12 LAB — HEPARIN LEVEL (UNFRACTIONATED): Heparin Unfractionated: 1.02 IU/mL — ABNORMAL HIGH (ref 0.30–0.70)

## 2017-11-12 LAB — RAPID URINE DRUG SCREEN, HOSP PERFORMED
AMPHETAMINES: NOT DETECTED
Benzodiazepines: NOT DETECTED
Cocaine: NOT DETECTED
OPIATES: NOT DETECTED
Tetrahydrocannabinol: NOT DETECTED

## 2017-11-12 MED ORDER — ESCITALOPRAM OXALATE 10 MG PO TABS: 10.0000 mg | ORAL_TABLET | Freq: Every day | ORAL | Status: DC

## 2017-11-12 MED ORDER — IOPAMIDOL (ISOVUE-370) INJECTION 76%
INTRAVENOUS | Status: AC
  Filled 2017-11-12: qty 50

## 2017-11-12 MED ORDER — PANTOPRAZOLE SODIUM 40 MG PO TBEC
40.0000 mg | DELAYED_RELEASE_TABLET | Freq: Every day | ORAL | Status: DC
  Administered 2017-11-12 – 2017-11-15 (×4): 40 mg via ORAL
  Filled 2017-11-12 (×5): qty 1

## 2017-11-12 MED ORDER — IOPAMIDOL (ISOVUE-370) INJECTION 76%
50.0000 mL | Freq: Once | INTRAVENOUS | Status: AC | PRN
  Administered 2017-11-12: 50 mL via INTRAVENOUS

## 2017-11-12 MED ORDER — ASPIRIN EC 325 MG PO TBEC
325.0000 mg | DELAYED_RELEASE_TABLET | Freq: Every day | ORAL | Status: DC
  Administered 2017-11-12 – 2017-11-15 (×4): 325 mg via ORAL
  Filled 2017-11-12 (×5): qty 1

## 2017-11-12 MED ORDER — HEPARIN (PORCINE) IN NACL 100-0.45 UNIT/ML-% IJ SOLN
800.0000 [IU]/h | INTRAMUSCULAR | Status: DC
  Administered 2017-11-12: 900 [IU]/h via INTRAVENOUS
  Filled 2017-11-12: qty 250

## 2017-11-12 MED ORDER — HEPARIN BOLUS VIA INFUSION
4000.0000 [IU] | Freq: Once | INTRAVENOUS | Status: AC
  Administered 2017-11-12: 4000 [IU] via INTRAVENOUS
  Filled 2017-11-12: qty 4000

## 2017-11-12 MED ORDER — TRAZODONE HCL 50 MG PO TABS: 50.0000 mg | ORAL_TABLET | Freq: Every day | ORAL | Status: DC

## 2017-11-12 NOTE — Progress Notes (Signed)
Deborah Reid is a 67 y.o. female patient admitted from ED awake, alert - oriented  X 4 - no acute distress noted.  VSS - Blood pressure 127/66, pulse 76, resp. rate 16, height 5\' 2"  (1.575 m), weight 90.7 kg (200 lb), SpO2 98 %.    IV in place, occlusive dsg intact without redness.  Orientation to room, and floor completed with information packet given to patient/family.  Patient declined safety video at this time.  Admission INP armband ID verified with patient/family, and in place.   SR up x 2, fall assessment complete, with patient and family able to verbalize understanding of risk associated with falls, and verbalized understanding to call nsg before up out of bed.  Call light within reach, patient able to voice, and demonstrate understanding.  Skin, clean-dry- intact without evidence of bruising, or skin tears.   No evidence of skin break down noted on exam.     Will cont to eval and treat per MD orders.  Celine Ahr, RN 11/12/2017 5:01 PM

## 2017-11-12 NOTE — Progress Notes (Signed)
ANTICOAGULATION CONSULT NOTE - Follow Up Consult  Pharmacy Consult for Heparin Indication: chest pain/ACS  No Known Allergies  Patient Measurements: Height: 5\' 2"  (157.5 cm) Weight: 200 lb (90.7 kg) IBW/kg (Calculated) : 50.1 Heparin Dosing Weight:   Vital Signs: Temp: 98.2 F (36.8 C) (06/21 2020) Temp Source: Oral (06/21 2020) BP: 126/63 (06/21 2020) Pulse Rate: 93 (06/21 2020)  Labs: Recent Labs    11/12/17 0900 11/12/17 0913 11/12/17 2035  HGB 13.1 14.3  --   HCT 42.7 42.0  --   PLT 207  --   --   APTT 27  --   --   LABPROT 13.5  --   --   INR 1.04  --   --   HEPARINUNFRC  --   --  1.02*  CREATININE 1.12* 1.00  --     Estimated Creatinine Clearance: 57.1 mL/min (by C-G formula based on SCr of 1 mg/dL).   Assessment:  Anticoag: Heparin for r/o ACS (abnormal EKG + syncope) - CBC WNL, no AC PTA - MRI neg for stroke, FYI documented weight is an estimate Initial HL 1.02 elevated possibly from bolus effect.  Goal of Therapy:  Heparin level 0.3-0.7 units/ml Monitor platelets by anticoagulation protocol: Yes   Plan:  Decrease IV Heparin to 800 units/hr. Allow time for clearance. Daily HL and CBC in AM  Weylin Plagge S. Alford Highland, PharmD, BCPS Clinical Staff Pharmacist Pager [phone]  [place], [place] 11/12/2017,10:03 PM

## 2017-11-12 NOTE — H&P (Addendum)
History and Physical    Deborah Reid JKK:[id number] DOB: 1951-04-18 DOA: 11/12/2017  **Will place patient in observation status based on the expectation that the patient will need hospitalization/ hospital care for less than or equal to 24 hours  PCP: Sherryl Barters, NP   Attending physician: [place]  Patient coming from/Resides with: Private residence  Chief Complaint: Syncopal episode  HPI: Deborah Reid is a 67 y.o. female with medical history significant for reported RA not on DMARDs, chronic hep C, GERD, asthma, glaucoma and depression.  Shunt was seen in the ER on 6/1 after falling forward squatting position.  Exam and imaging at that time were unremarkable.  She was discharged with Robaxin prn.  She only took 2 doses and has not taken any doses in over a week.  She returned to the ER today via EMS after having a witnessed syncopal episode.  At the scene bystanders felt the patient was not breathing and did not have a pulse so CPR was initiated.  Fire department arrived shortly thereafter and determined patient had a pulse and was breathing spontaneously so resuscitation was discontinued.  Upon their evaluation she was mumbling and moving her eyelids to voice but otherwise was altered.  She would not squeeze their hands.  Her pupils were pinpoint but equal.  Symptoms persisted after patient arrived to the ER and due to concerns of a possible acute ischemic stroke code stroke was initiated.  Initial imaging was unremarkable for head bleed.  Neurologist evaluate the patient and patient subsequently undergone MRI which was negative for stroke so code stroke discontinued.  EDP felt initial EKG and labs are unremarkable.  She was not hypoxic.  She initially was hypertensive but is now normotensive with SBP 110-120.  She is not tachycardic.  She has not had any documented arrhythmia.  EDP has asked [place] to evaluate and admit for unexplained syncopal episode.  ED Course:  Vital Signs: BP 116/63    Pulse 80   Resp 17   Wt 90.7 kg (200 lb)   SpO2 97%   BMI 36.58 kg/m  CT head: Neg CT angiography head and neck: Neg MR brain: Negative for acute infarct; atrophy and chronic microvascular ischemic changes in the white matter Lab data: Sodium 141, potassium 4.0, chloride 108, CO2 25, glucose 101, BUN 13, creatinine 1.12, anion gap 8, LFTs normal, troponin normal, white count 5600 with normal differential, hemoglobin 13.1, platelets 207,000, cyst unremarkable except for straw-colored, small hemoglobin and trace leukocytes, UDS negative, EtOH pending Medications and treatments: None  Review of Systems:  In addition to the HPI above,  No Fever-chills, myalgias or other constitutional symptoms No Headache, changes with Vision or hearing, new focal weakness, tingling, numbness in any extremity, dysarthria or word finding difficulty, gait disturbance or imbalance, no obvious tremors or seizure activity No problems swallowing food or Liquids, indigestion/reflux, choking or coughing while eating, abdominal pain with or after eating No Chest pain, Cough or Shortness of Breath, palpitations, orthopnea or DOE No Abdominal pain, melena,hematochezia, dark tarry stools, constipation No dysuria, malodorous urine, hematuria or flank pain No new skin rashes, lesions, masses or bruises, No new joint pains, aches, swelling or redness No recent unintentional weight gain or loss No polyuria, polydypsia or polyphagia   Past Medical History:  Diagnosis Date  . Asthma   . Depression   . GERD (gastroesophageal reflux disease)   . Glaucoma   . Hepatitis C   . Multiple sclerosis ([place])   .  Rheumatoid arthritis [facility])     Past Surgical History:  Procedure Laterality Date  . CHOLECYSTECTOMY      Social History   Socioeconomic History  . Marital status: Single    Spouse name: Not on file  . Number of children: Not on file  . Years of education: 31  . Highest education level: Not on file    Occupational History  . Occupation: Management consultant: UNEMPLOYED    Comment: Multiple Sclerosis  Social Needs  . Financial resource strain: Not on file  . Food insecurity:    Worry: Not on file    Inability: Not on file  . Transportation needs:    Medical: Not on file    Non-medical: Not on file  Tobacco Use  . Smoking status: Former Smoker    Types: Cigarettes    Last attempt to quit: 06/20/1973    Years since quitting: 44.4  . Smokeless tobacco: Never Used  Substance and Sexual Activity  . Alcohol use: No  . Drug use: No  . Sexual activity: Not on file  Lifestyle  . Physical activity:    Days per week: Not on file    Minutes per session: Not on file  . Stress: Not on file  Relationships  . Social connections:    Talks on phone: Not on file    Gets together: Not on file    Attends religious service: Not on file    Active member of club or organization: Not on file    Attends meetings of clubs or organizations: Not on file    Relationship status: Not on file  . Intimate partner violence:    Fear of current or ex partner: Not on file    Emotionally abused: Not on file    Physically abused: Not on file    Forced sexual activity: Not on file  Other Topics Concern  . Not on file  Social History Narrative  . Not on file    Mobility: Primarily independent although occasionally will utilize a cane Work history: Not obtained   No Known Allergies  Family History  Problem Relation Age of Onset  . Arthritis Mother   . Depression Mother   . Alcohol abuse Father   . Arthritis Father   . Transient ischemic attack Sister   . Cancer Brother        prostate cancer  . Arthritis Sister   . Lupus Sister   . Hypertension Sister   . Heart disease Maternal Grandmother   . Hypertension Maternal Grandmother   . Diabetes Maternal Grandfather   . Hypertension Paternal Grandmother      Prior to Admission medications   Medication Sig Start Date End Date Taking?  Authorizing Provider  cholecalciferol (VITAMIN D) 1000 UNITS tablet Take 1 tablet (1,000 Units total) by mouth daily. Patient not taking: Reported on 11/12/2017 09/18/13   Sherryl Barters, NP  escitalopram (LEXAPRO) 10 MG tablet Take 1 tablet (10 mg total) by mouth at bedtime. Patient not taking: Reported on 11/12/2017 09/18/13   Sherryl Barters, NP  methocarbamol (ROBAXIN) 500 MG tablet Take 1 tablet (500 mg total) by mouth 2 (two) times daily. Patient not taking: Reported on 11/12/2017 10/23/17   Jeannett Senior, PA-C  omeprazole (PRILOSEC) 40 MG capsule Take 40 mg by mouth daily.    [provider]  traZODone (DESYREL) 50 MG tablet Take 1 tablet (50 mg total) by mouth at bedtime. Patient not taking: Reported on 11/12/2017  09/18/13   Sherryl Barters, NP    Physical Exam: Vitals:   11/12/17 1106 11/12/17 1115 11/12/17 1200 11/12/17 1245  BP: (!) 162/68 (!) 151/72 123/65 116/63  Pulse: 74 74 73 80  Resp: 13 15 18 17   SpO2: 99% 99% 99% 97%  Weight:          Constitutional: NAD, calm, comfortable Eyes: PERRL, lids and conjunctivae normal ENMT: Mucous membranes are moist. Posterior pharynx clear of any exudate or lesions.Normal dentition.  Neck: normal, supple, no masses, no thyromegaly Respiratory: clear to auscultation bilaterally, no wheezing, no crackles. Normal respiratory effort. No accessory muscle use.  Anterior chest wall mildly tender to direct palpation. Cardiovascular: Regular rate and rhythm, no murmurs / rubs / gallops. No extremity edema. 2+ pedal pulses. No carotid bruits.  Abdomen: no tenderness, no masses palpated. No hepatosplenomegaly. Bowel sounds positive.  Musculoskeletal: no clubbing / cyanosis. No joint deformity upper and lower extremities. Good ROM, no contractures. Normal muscle tone.  Skin: no rashes, lesions, ulcers. No induration Neurologic: CN 2-12 grossly intact. Sensation intact, DTR normal. Strength 5/5 x all 4 extremities.  Psychiatric: Normal judgment  and insight. Alert and oriented x 3. Normal mood.    Labs on Admission: I have personally reviewed following labs and imaging studies  CBC: Recent Labs  Lab 11/12/17 0900 11/12/17 0913  WBC 5.6  --   NEUTROABS 2.4  --   HGB 13.1 14.3  HCT 42.7 42.0  MCV 88.2  --   PLT 207  --    Basic Metabolic Panel: Recent Labs  Lab 11/12/17 0900 11/12/17 0913  NA 141 141  K 4.0 4.0  CL 108 106  CO2 25  --   GLUCOSE 101* 98  BUN 13 16  CREATININE 1.12* 1.00  CALCIUM 9.7  --    GFR: Estimated Creatinine Clearance: 57.1 mL/min (by C-G formula based on SCr of 1 mg/dL). Liver Function Tests: Recent Labs  Lab 11/12/17 0900  AST 29  ALT 24  ALKPHOS 78  BILITOT 0.8  PROT 7.6  ALBUMIN 3.8   No results for input(s): LIPASE, AMYLASE in the last 168 hours. No results for input(s): AMMONIA in the last 168 hours. Coagulation Profile: Recent Labs  Lab 11/12/17 0900  INR 1.04   Cardiac Enzymes: No results for input(s): CKTOTAL, CKMB, CKMBINDEX, TROPONINI in the last 168 hours. BNP (last 3 results) No results for input(s): PROBNP in the last 8760 hours. HbA1C: No results for input(s): HGBA1C in the last 72 hours. CBG: No results for input(s): GLUCAP in the last 168 hours. Lipid Profile: No results for input(s): CHOL, HDL, LDLCALC, TRIG, CHOLHDL, LDLDIRECT in the last 72 hours. Thyroid Function Tests: No results for input(s): TSH, T4TOTAL, FREET4, T3FREE, THYROIDAB in the last 72 hours. Anemia Panel: No results for input(s): VITAMINB12, FOLATE, FERRITIN, TIBC, IRON, RETICCTPCT in the last 72 hours. Urine analysis:    Component Value Date/Time   COLORURINE STRAW (A) 11/12/2017 1105   APPEARANCEUR CLEAR 11/12/2017 1105   LABSPEC 1.026 11/12/2017 1105   PHURINE 7.0 11/12/2017 1105   GLUCOSEU NEGATIVE 11/12/2017 1105   HGBUR SMALL (A) 11/12/2017 1105   BILIRUBINUR NEGATIVE 11/12/2017 1105   KETONESUR NEGATIVE 11/12/2017 1105   PROTEINUR NEGATIVE 11/12/2017 1105    UROBILINOGEN 1.0 10/04/2010 1455   NITRITE NEGATIVE 11/12/2017 1105   LEUKOCYTESUR TRACE (A) 11/12/2017 1105   Sepsis Labs: @LABRCNTIP (procalcitonin:4,lacticidven:4) )No results found for this or any previous visit (from the past 240 hour(s)).   Radiological  Exams on Admission: Ct Angio Head W Or Wo Contrast  Result Date: 11/12/2017 CLINICAL DATA:  Syncopal episode, now unresponsive. EXAM: CT ANGIOGRAPHY HEAD AND NECK TECHNIQUE: Multidetector CT imaging of the head and neck was performed using the standard protocol during bolus administration of intravenous contrast. Multiplanar CT image reconstructions and MIPs were obtained to evaluate the vascular anatomy. Carotid stenosis measurements (when applicable) are obtained utilizing NASCET criteria, using the distal internal carotid diameter as the denominator. CONTRAST:  22mL ISOVUE-370 IOPAMIDOL (ISOVUE-370) INJECTION 76% COMPARISON:  Code stroke CT earlier in the day. FINDINGS: CTA NECK FINDINGS Aortic arch: Standard branching. Imaged portion shows no evidence of aneurysm or dissection. No significant stenosis of the major arch vessel origins. Dolichoectasia. Minor atheromatous change. Right carotid system: No evidence of dissection, stenosis (50% or greater) or occlusion. Left carotid system: No evidence of dissection, stenosis (50% or greater) or occlusion. Vertebral arteries: LEFT vertebral slightly dominant, both patent. No evidence dissection, stenosis, or occlusion. No significant ostial narrowing or proximal subclavian disease. Skeleton: Mild spondylosis. No worrisome osseous lesion. No neck masses. Airway patent. Other neck: No masses. Upper chest: None. Review of the MIP images confirms the above findings CTA HEAD FINDINGS Anterior circulation: No significant stenosis, proximal occlusion, aneurysm, or vascular malformation. Posterior circulation: No significant stenosis, proximal occlusion, aneurysm, or vascular malformation. Both vertebrals  contribute to formation of the basilar. Venous sinuses: As permitted by contrast timing, patent. Anatomic variants: None of significance. Delayed phase: Not performed. Review of the MIP images confirms the above findings IMPRESSION: No extracranial or intracranial flow-limiting stenosis, dissection, or occlusion. With regard to the clinical picture of syncopal episode followed by unresponsiveness, the basilar artery specifically, and its major branches, appear widely patent. Electronically Signed   By: Staci Righter M.D.   On: 11/12/2017 09:38   Ct Angio Neck W Or Wo Contrast  Result Date: 11/12/2017 CLINICAL DATA:  Syncopal episode, now unresponsive. EXAM: CT ANGIOGRAPHY HEAD AND NECK TECHNIQUE: Multidetector CT imaging of the head and neck was performed using the standard protocol during bolus administration of intravenous contrast. Multiplanar CT image reconstructions and MIPs were obtained to evaluate the vascular anatomy. Carotid stenosis measurements (when applicable) are obtained utilizing NASCET criteria, using the distal internal carotid diameter as the denominator. CONTRAST:  41mL ISOVUE-370 IOPAMIDOL (ISOVUE-370) INJECTION 76% COMPARISON:  Code stroke CT earlier in the day. FINDINGS: CTA NECK FINDINGS Aortic arch: Standard branching. Imaged portion shows no evidence of aneurysm or dissection. No significant stenosis of the major arch vessel origins. Dolichoectasia. Minor atheromatous change. Right carotid system: No evidence of dissection, stenosis (50% or greater) or occlusion. Left carotid system: No evidence of dissection, stenosis (50% or greater) or occlusion. Vertebral arteries: LEFT vertebral slightly dominant, both patent. No evidence dissection, stenosis, or occlusion. No significant ostial narrowing or proximal subclavian disease. Skeleton: Mild spondylosis. No worrisome osseous lesion. No neck masses. Airway patent. Other neck: No masses. Upper chest: None. Review of the MIP images confirms  the above findings CTA HEAD FINDINGS Anterior circulation: No significant stenosis, proximal occlusion, aneurysm, or vascular malformation. Posterior circulation: No significant stenosis, proximal occlusion, aneurysm, or vascular malformation. Both vertebrals contribute to formation of the basilar. Venous sinuses: As permitted by contrast timing, patent. Anatomic variants: None of significance. Delayed phase: Not performed. Review of the MIP images confirms the above findings IMPRESSION: No extracranial or intracranial flow-limiting stenosis, dissection, or occlusion. With regard to the clinical picture of syncopal episode followed by unresponsiveness, the basilar artery specifically, and its  major branches, appear widely patent. Electronically Signed   By: Staci Righter M.D.   On: 11/12/2017 09:38   Mr Brain Wo Contrast  Result Date: 11/12/2017 CLINICAL DATA:  Syncope, fall.  Neurologic deficit. EXAM: MRI HEAD WITHOUT CONTRAST TECHNIQUE: Multiplanar, multiecho pulse sequences of the brain and surrounding structures were obtained without intravenous contrast. COMPARISON:  CTA 11/12/2017 FINDINGS: Limited imaging with diffusion-weighted imaging and axial FLAIR imaging only. Mild atrophy. Negative for hydrocephalus. Chronic microvascular ischemic changes in the white matter. Mild chronic ischemia in the pons Negative for acute infarct.  No midline shift. IMPRESSION: Negative for acute infarct. Atrophy and chronic microvascular ischemic changes in the white matter. Electronically Signed   By: Franchot Gallo M.D.   On: 11/12/2017 10:10   Ct Head Code Stroke Wo Contrast  Result Date: 11/12/2017 CLINICAL DATA:  Code stroke. Altered mental status. Last seen normal 1 hour ago. Witnessed syncopal episode. Previous fall 2 weeks ago. EXAM: CT HEAD WITHOUT CONTRAST TECHNIQUE: Contiguous axial images were obtained from the base of the skull through the vertex without intravenous contrast. COMPARISON:  10/23/2017.  FINDINGS: Brain: No evidence for acute infarction, hemorrhage, mass lesion, hydrocephalus, or extra-axial fluid. Mild atrophy. Hypoattenuation of white matter, likely small vessel disease. Vascular: No hyperdense vessel or unexpected calcification. Skull: Calvarium intact. Sinuses/Orbits: Negative. Other: None. ASPECTS [facility] Stroke Program Early CT Score) - Ganglionic level infarction (caudate, lentiform nuclei, internal capsule, insula, M1-M3 cortex): 7 - Supraganglionic infarction (M4-M6 cortex): 3 Total score (0-10 with 10 being normal): 10 IMPRESSION: 1. Atrophy and small vessel disease. No acute intracranial findings. 2. ASPECTS is 10. These results were communicated to Dr. Lorraine Lax at 9:19 amon 6/21/2019by text page via the [facility] messaging system. Electronically Signed   By: Staci Righter M.D.   On: 11/12/2017 09:21    EKG: (Independently reviewed) sinus rhythm with ventricular rate 74 bpm, QTC 429 ms, patient has 1 mm ST segment elevation in leads I, aVL, V4 through V6 with downsloping ST depression in leads II, III and aVF.  This is new when compared to last baseline EKG from June 2013.  Assessment/Plan Principal Problem:   Syncope -Patient initially presented as witnessed syncope with altered mentation initially felt to be related to possible ischemic stroke with subsequent neurological evaluation negative.  In discussing with patient she has been having 1 week of hot flashes, diarrhea, nausea, lightheadedness and unusual sensation in her face eyes and tongue consistent with numbness and or swelling.  The symptoms have also been occurring with exertion.  Of note, upon review of EKG she does have some EKG changes that could be related to ischemia (see above) -Echocardiogram -IV heparin -Cardiology consultation -Mobilize with assistance/PT evaluation -Aspirin 325 mg now and daily -Risk stratification: Fasting lipid panel -Risk factors: Postmenopausal female, positive family history, lipid  status unknown -Systolic BP somewhat suboptimal so we will hold off on beta-blocker for now -Symptoms potentially related to atypical seizure activity so for completeness of evaluation obtain EEG  Active Problems:   Rheumatoid arthritis  -Not on disease modifying medications prior to admission -Serological status unknown    MS -Not on meds    Asthma -Not actively wheezing -Not on SABA or LABA prior to admission    Depressive disorder -Med rec reports that patient no longer on Lexapro or trazodone but when questioned she reported she was still taking these medications; will hold for now    Hepatitis C -Current LFTs within normal limits    GERD (gastroesophageal reflux  disease) -Continue PPI    **Additional lab, imaging and/or diagnostic evaluation at discretion of supervising physician  DVT prophylaxis: Full dose heparin Code Status: Full Family Communication: Family at bedside Disposition Plan: Home Consults called: Neurology; Cardiology/[facility] on call    ELLIS,ALLISON L. ANP-BC [facility] Pager [phone]   If 7PM-7AM, please contact night-coverage [email or web address] Password [email or web address]  11/12/2017, 1:35 PM

## 2017-11-12 NOTE — ED Provider Notes (Signed)
[facility] EMERGENCY DEPARTMENT Provider Note   CSN: [id number] Arrival date & time: 11/12/17  0848     History   Chief Complaint Chief Complaint  Patient presents with  . Altered Mental Status  . Code Stroke    HPI Deborah Reid is a 67 y.o. female.  Level 5 caveat for acuity of condition and altered mental status.  Patient allegedly had a syncopal episode this morning.  A family member started CPR.  [facility] fire department arrived and compressions were terminated.  Patient was breathing and had a pulse at that time.  Patient was mumbling, but not answering questions directly.  She would not move her extremities to command.  On arrival to the emergency department, a code stroke was initiated.  Past medical history includes MS, hepatitis C, RA, GERD, depression, asthma, glaucoma, obesity.     Past Medical History:  Diagnosis Date  . Asthma   . Depression   . GERD (gastroesophageal reflux disease)   . Glaucoma   . Hepatitis C   . Multiple sclerosis ([place])   . Rheumatoid arthritis [facility])     Patient Active Problem List   Diagnosis Date Noted  . Syncope 11/12/2017  . Hepatitis C 11/12/2017  . Rheumatoid arthritis ([place]) 11/12/2017  . GERD (gastroesophageal reflux disease) 11/12/2017  . Depressive disorder 11/12/2017  . Depression 06/21/2013  . Sleep disturbance 06/21/2013  . Numerous moles 06/21/2013  . Screening for skin cancer 06/21/2013  . HEPATITIS C, CHRONIC 07/01/2009  . MULTIPLE SCLEROSIS 07/01/2009  . ESOPHAGEAL REFLUX 07/01/2009  . DYSPHAGIA UNSPECIFIED 07/01/2009    Past Surgical History:  Procedure Laterality Date  . CHOLECYSTECTOMY       OB History   None      Home Medications    Prior to Admission medications   Medication Sig Start Date End Date Taking? Authorizing Provider  cholecalciferol (VITAMIN D) 1000 UNITS tablet Take 1 tablet (1,000 Units total) by mouth daily. Patient not taking: Reported on 11/12/2017 09/18/13    Sherryl Barters, NP  escitalopram (LEXAPRO) 10 MG tablet Take 1 tablet (10 mg total) by mouth at bedtime. Patient not taking: Reported on 11/12/2017 09/18/13   Sherryl Barters, NP  methocarbamol (ROBAXIN) 500 MG tablet Take 1 tablet (500 mg total) by mouth 2 (two) times daily. Patient not taking: Reported on 11/12/2017 10/23/17   Jeannett Senior, PA-C  omeprazole (PRILOSEC) 40 MG capsule Take 40 mg by mouth daily.    [provider]  traZODone (DESYREL) 50 MG tablet Take 1 tablet (50 mg total) by mouth at bedtime. Patient not taking: Reported on 11/12/2017 09/18/13   Sherryl Barters, NP    Family History Family History  Problem Relation Age of Onset  . Arthritis Mother   . Depression Mother   . Alcohol abuse Father   . Arthritis Father   . Transient ischemic attack Sister   . Cancer Brother        prostate cancer  . Arthritis Sister   . Lupus Sister   . Hypertension Sister   . Heart disease Maternal Grandmother   . Hypertension Maternal Grandmother   . Diabetes Maternal Grandfather   . Hypertension Paternal Grandmother     Social History Social History   Tobacco Use  . Smoking status: Former Smoker    Types: Cigarettes    Last attempt to quit: 06/20/1973    Years since quitting: 44.4  . Smokeless tobacco: Never Used  Substance Use Topics  .  Alcohol use: No  . Drug use: No     Allergies   Patient has no known allergies.   Review of Systems Review of Systems  Unable to perform ROS: Acuity of condition     Physical Exam Updated Vital Signs BP 116/63   Pulse 80   Resp 17   Wt 90.7 kg (200 lb)   SpO2 97%   BMI 36.58 kg/m   Physical Exam  Constitutional:  Obtunded  HENT:  Head: Normocephalic and atraumatic.  Eyes: Conjunctivae are normal.  Neck: Neck supple.  Cardiovascular: Normal rate and regular rhythm.  Pulmonary/Chest: Effort normal and breath sounds normal.  Abdominal: Soft. Bowel sounds are normal.  Musculoskeletal:  Unable  Neurological:    Obtunded  Skin: Skin is warm and dry.  Psychiatric:  Unable  Nursing note and vitals reviewed.    ED Treatments / Results  Labs (all labs ordered are listed, but only abnormal results are displayed) Labs Reviewed  COMPREHENSIVE METABOLIC PANEL - Abnormal; Notable for the following components:      Result Value   Glucose, Bld 101 (*)    Creatinine, Ser 1.12 (*)    GFR calc non Af Amer 50 (*)    GFR calc Af Amer 58 (*)    All other components within normal limits  RAPID URINE DRUG SCREEN, HOSP PERFORMED - Abnormal; Notable for the following components:   Barbiturates   (*)    Value: Result not available. Reagent lot number recalled by manufacturer.   All other components within normal limits  URINALYSIS, ROUTINE W REFLEX MICROSCOPIC - Abnormal; Notable for the following components:   Color, Urine STRAW (*)    Hgb urine dipstick SMALL (*)    Leukocytes, UA TRACE (*)    All other components within normal limits  PROTIME-INR  APTT  CBC  DIFFERENTIAL  ETHANOL  I-STAT CHEM 8, ED  I-STAT TROPONIN, ED    EKG EKG Interpretation  Date/Time:  Friday November 12 2017 08:56:46 EDT Ventricular Rate:  74 PR Interval:    QRS Duration: 78 QT Interval:  386 QTC Calculation: 429 R Axis:   33 Text Interpretation:  Sinus rhythm Borderline ST depression, inferior leads Borderline ST elevation, lateral leads Confirmed by Nat Christen [id number]) on 11/12/2017 11:02:43 AM   Radiology Ct Angio Head W Or Wo Contrast  Result Date: 11/12/2017 CLINICAL DATA:  Syncopal episode, now unresponsive. EXAM: CT ANGIOGRAPHY HEAD AND NECK TECHNIQUE: Multidetector CT imaging of the head and neck was performed using the standard protocol during bolus administration of intravenous contrast. Multiplanar CT image reconstructions and MIPs were obtained to evaluate the vascular anatomy. Carotid stenosis measurements (when applicable) are obtained utilizing NASCET criteria, using the distal internal carotid diameter as  the denominator. CONTRAST:  39mL ISOVUE-370 IOPAMIDOL (ISOVUE-370) INJECTION 76% COMPARISON:  Code stroke CT earlier in the day. FINDINGS: CTA NECK FINDINGS Aortic arch: Standard branching. Imaged portion shows no evidence of aneurysm or dissection. No significant stenosis of the major arch vessel origins. Dolichoectasia. Minor atheromatous change. Right carotid system: No evidence of dissection, stenosis (50% or greater) or occlusion. Left carotid system: No evidence of dissection, stenosis (50% or greater) or occlusion. Vertebral arteries: LEFT vertebral slightly dominant, both patent. No evidence dissection, stenosis, or occlusion. No significant ostial narrowing or proximal subclavian disease. Skeleton: Mild spondylosis. No worrisome osseous lesion. No neck masses. Airway patent. Other neck: No masses. Upper chest: None. Review of the MIP images confirms the above findings CTA HEAD FINDINGS Anterior circulation:  No significant stenosis, proximal occlusion, aneurysm, or vascular malformation. Posterior circulation: No significant stenosis, proximal occlusion, aneurysm, or vascular malformation. Both vertebrals contribute to formation of the basilar. Venous sinuses: As permitted by contrast timing, patent. Anatomic variants: None of significance. Delayed phase: Not performed. Review of the MIP images confirms the above findings IMPRESSION: No extracranial or intracranial flow-limiting stenosis, dissection, or occlusion. With regard to the clinical picture of syncopal episode followed by unresponsiveness, the basilar artery specifically, and its major branches, appear widely patent. Electronically Signed   By: Staci Righter M.D.   On: 11/12/2017 09:38   Ct Angio Neck W Or Wo Contrast  Result Date: 11/12/2017 CLINICAL DATA:  Syncopal episode, now unresponsive. EXAM: CT ANGIOGRAPHY HEAD AND NECK TECHNIQUE: Multidetector CT imaging of the head and neck was performed using the standard protocol during bolus  administration of intravenous contrast. Multiplanar CT image reconstructions and MIPs were obtained to evaluate the vascular anatomy. Carotid stenosis measurements (when applicable) are obtained utilizing NASCET criteria, using the distal internal carotid diameter as the denominator. CONTRAST:  18mL ISOVUE-370 IOPAMIDOL (ISOVUE-370) INJECTION 76% COMPARISON:  Code stroke CT earlier in the day. FINDINGS: CTA NECK FINDINGS Aortic arch: Standard branching. Imaged portion shows no evidence of aneurysm or dissection. No significant stenosis of the major arch vessel origins. Dolichoectasia. Minor atheromatous change. Right carotid system: No evidence of dissection, stenosis (50% or greater) or occlusion. Left carotid system: No evidence of dissection, stenosis (50% or greater) or occlusion. Vertebral arteries: LEFT vertebral slightly dominant, both patent. No evidence dissection, stenosis, or occlusion. No significant ostial narrowing or proximal subclavian disease. Skeleton: Mild spondylosis. No worrisome osseous lesion. No neck masses. Airway patent. Other neck: No masses. Upper chest: None. Review of the MIP images confirms the above findings CTA HEAD FINDINGS Anterior circulation: No significant stenosis, proximal occlusion, aneurysm, or vascular malformation. Posterior circulation: No significant stenosis, proximal occlusion, aneurysm, or vascular malformation. Both vertebrals contribute to formation of the basilar. Venous sinuses: As permitted by contrast timing, patent. Anatomic variants: None of significance. Delayed phase: Not performed. Review of the MIP images confirms the above findings IMPRESSION: No extracranial or intracranial flow-limiting stenosis, dissection, or occlusion. With regard to the clinical picture of syncopal episode followed by unresponsiveness, the basilar artery specifically, and its major branches, appear widely patent. Electronically Signed   By: Staci Righter M.D.   On: 11/12/2017 09:38     Mr Brain Wo Contrast  Result Date: 11/12/2017 CLINICAL DATA:  Syncope, fall.  Neurologic deficit. EXAM: MRI HEAD WITHOUT CONTRAST TECHNIQUE: Multiplanar, multiecho pulse sequences of the brain and surrounding structures were obtained without intravenous contrast. COMPARISON:  CTA 11/12/2017 FINDINGS: Limited imaging with diffusion-weighted imaging and axial FLAIR imaging only. Mild atrophy. Negative for hydrocephalus. Chronic microvascular ischemic changes in the white matter. Mild chronic ischemia in the pons Negative for acute infarct.  No midline shift. IMPRESSION: Negative for acute infarct. Atrophy and chronic microvascular ischemic changes in the white matter. Electronically Signed   By: Franchot Gallo M.D.   On: 11/12/2017 10:10   Ct Head Code Stroke Wo Contrast  Result Date: 11/12/2017 CLINICAL DATA:  Code stroke. Altered mental status. Last seen normal 1 hour ago. Witnessed syncopal episode. Previous fall 2 weeks ago. EXAM: CT HEAD WITHOUT CONTRAST TECHNIQUE: Contiguous axial images were obtained from the base of the skull through the vertex without intravenous contrast. COMPARISON:  10/23/2017. FINDINGS: Brain: No evidence for acute infarction, hemorrhage, mass lesion, hydrocephalus, or extra-axial fluid. Mild atrophy. Hypoattenuation of  white matter, likely small vessel disease. Vascular: No hyperdense vessel or unexpected calcification. Skull: Calvarium intact. Sinuses/Orbits: Negative. Other: None. ASPECTS [facility] Stroke Program Early CT Score) - Ganglionic level infarction (caudate, lentiform nuclei, internal capsule, insula, M1-M3 cortex): 7 - Supraganglionic infarction (M4-M6 cortex): 3 Total score (0-10 with 10 being normal): 10 IMPRESSION: 1. Atrophy and small vessel disease. No acute intracranial findings. 2. ASPECTS is 10. These results were communicated to Dr. Lorraine Lax at 9:19 amon 6/21/2019by text page via the [facility] messaging system. Electronically Signed   By: Staci Righter M.D.   On:  11/12/2017 09:21    Procedures Procedures (including critical care time)  Medications Ordered in ED Medications  iopamidol (ISOVUE-370) 76 % injection (has no administration in time range)  iopamidol (ISOVUE-370) 76 % injection 50 mL (50 mLs Intravenous Contrast Given 11/12/17 0927)     Initial Impression / Assessment and Plan / ED Course  I have reviewed the triage vital signs and the nursing notes.  Pertinent labs & imaging results that were available during my care of the patient were reviewed by me and considered in my medical decision making (see chart for details).     Uncertain etiology of patient's presenting symptom complex.  She initially appeared very obtunded and was unable to answer questions or move her extremities.  Over time she became more alert and conversant.  Code stroke was initiated.  Screening tests showed no life-threatening condition.  Will admit for further observation.   CRITICAL CARE Performed by: Nat Christen Total critical care time: 30 minutes Critical care time was exclusive of separately billable procedures and treating other patients. Critical care was necessary to treat or prevent imminent or life-threatening deterioration. Critical care was time spent personally by me on the following activities: development of treatment plan with patient and/or surrogate as well as nursing, discussions with consultants, evaluation of patient's response to treatment, examination of patient, obtaining history from patient or surrogate, ordering and performing treatments and interventions, ordering and review of laboratory studies, ordering and review of radiographic studies, pulse oximetry and re-evaluation of patient's condition.  Final Clinical Impressions(s) / ED Diagnoses   Final diagnoses:  Syncope, unspecified syncope type    ED Discharge Orders    None       Nat Christen, MD 11/12/17 1347

## 2017-11-12 NOTE — ED Notes (Signed)
Patient states that this morning her eyeballs felt puffy and her mouth was dry.  She head hurt and she felt really dizzy.  Patient is A&O x4.  States her mouth keeps clenching.  Her leg muscles keep contracting.

## 2017-11-12 NOTE — ED Notes (Signed)
EDP at bedside  

## 2017-11-12 NOTE — Consult Note (Addendum)
Cardiology Consultation:   Patient ID: Deborah Reid; [id number]; [birth year]   Admit date: 11/12/2017 Date of Consult: 11/12/2017  Primary Care Provider: Sherryl Barters, NP Primary Cardiologist: new to Dr. Ellyn Hack   Chief Complaint: passed out  Patient Profile:   Deborah Reid is a 67 y.o. female with a hx of multiple sclerosis, severe depression, hepatitis C, asthma, glaucoma, RA, normal coronaries in 02/2003 who is being seen today for the evaluation of possible syncope/abnormal EKG at the request of Erin Hearing NP.  History of Present Illness:   Deborah Reid remotely underwent cath in 02/2003 for chest discomfort which showed normal coronaries. In 2011 she was admitted for episode of weakness and speech deficit felt due to an MS flare. She reports she is also followed in mental health for severe depression. She was seen in the ED on 6/1 with a fall that occurred when she was leaning over to kill a spider - no LOC, but did hit her head on the ground and had residual headache. CT head was nonacute. She was discharged with Robaxin prn.  She only took 2 doses and has not taken any doses in over a week.   She states in the mornings she's been having trouble with her asthma with dyspnea and having to use either inhaler or neb with relief. She was feeling generally strange this morning, somewhat dizzy. She got up to go to the bathroom and felt as though maybe she'd stood up to fast. She felt very weak so walked slowly to the bathroom. When she finally got on the commode she had diarrhea. She also felt like her lips and face were becoming fuller. She was getting a hot flash but it was only localized on her back. She went back to the living room and sat down. Apparently her daughter witnessed her just fall over. Per report, bystanders felt the patient was not breathing and did not have a pulse so they initiated CPR. At the scene bystanders felt the patient was not breathing and did not have a  pulse so CPR was initiated. Fire department arrived shortly thereafter and determined patient had a pulse and was breathing spontaneously so resuscitation was discontinued. Upon their evaluation she was mumbling and moving her eyelids to voice but otherwise was altered. The patient does not recall any of this and does not know how long she was out for or how long she felt strangely for. Her pupils were reported to be pinpoint but equal. Symptoms persisted in the ER so code stroke was initiated. Per neurology, "while in CT patient continued to muble follow no commands but withdraw from pain. She held her jaw clenched tight and at time would open eyes and other times hold eyelids shut. MRI was obtained and was negative." Troponin is negative. CBC wnl. Cr 1.12 without interim value for comparison since 2013 when it was 0.87. VSS. She was initially hypertensive but is now normtensive without intervention. Pulse and pulse ox are wnl. She is generally sedentary but was hoping to join a gym soon. She denies any CP. She does report generalized dyspnea in the morning which she has attributed to asthma. No LEE, orthopnea, palpitations or prior history of syncope. Telemetry benign thus far. EKG is slightly abnormal so cardiology asked to evaluate.    Past Medical History:  Diagnosis Date  . Asthma   . Depression   . GERD (gastroesophageal reflux disease)   . Glaucoma   . Hepatitis C   .  Multiple sclerosis ([place])   . Rheumatoid arthritis [facility])     Past Surgical History:  Procedure Laterality Date  . CHOLECYSTECTOMY       Inpatient Medications: Scheduled Meds: . aspirin EC  325 mg Oral Daily  . iopamidol      . pantoprazole  40 mg Oral Daily   Continuous Infusions: . heparin 900 Units/hr (11/12/17 1435)   PRN Meds:   Home Meds: Prior to Admission medications   Medication Sig Start Date End Date Taking? Authorizing Provider  cholecalciferol (VITAMIN D) 1000 UNITS tablet Take 1 tablet (1,000 Units  total) by mouth daily. Patient not taking: Reported on 11/12/2017 09/18/13   Sherryl Barters, NP  escitalopram (LEXAPRO) 10 MG tablet Take 1 tablet (10 mg total) by mouth at bedtime. Patient not taking: Reported on 11/12/2017 09/18/13   Sherryl Barters, NP  methocarbamol (ROBAXIN) 500 MG tablet Take 1 tablet (500 mg total) by mouth 2 (two) times daily. Patient not taking: Reported on 11/12/2017 10/23/17   Jeannett Senior, PA-C  omeprazole (PRILOSEC) 40 MG capsule Take 40 mg by mouth daily.    [provider]  traZODone (DESYREL) 50 MG tablet Take 1 tablet (50 mg total) by mouth at bedtime. Patient not taking: Reported on 11/12/2017 09/18/13   Sherryl Barters, NP    Allergies:   No Known Allergies  Social History:   Social History   Socioeconomic History  . Marital status: Single    Spouse name: Not on file  . Number of children: Not on file  . Years of education: 92  . Highest education level: Not on file  Occupational History  . Occupation: Management consultant: UNEMPLOYED    Comment: Multiple Sclerosis  Social Needs  . Financial resource strain: Not on file  . Food insecurity:    Worry: Not on file    Inability: Not on file  . Transportation needs:    Medical: Not on file    Non-medical: Not on file  Tobacco Use  . Smoking status: Former Smoker    Types: Cigarettes    Last attempt to quit: 06/20/1973    Years since quitting: 44.4  . Smokeless tobacco: Never Used  Substance and Sexual Activity  . Alcohol use: No  . Drug use: No  . Sexual activity: Not on file  Lifestyle  . Physical activity:    Days per week: Not on file    Minutes per session: Not on file  . Stress: Not on file  Relationships  . Social connections:    Talks on phone: Not on file    Gets together: Not on file    Attends religious service: Not on file    Active member of club or organization: Not on file    Attends meetings of clubs or organizations: Not on file    Relationship status: Not on  file  . Intimate partner violence:    Fear of current or ex partner: Not on file    Emotionally abused: Not on file    Physically abused: Not on file    Forced sexual activity: Not on file  Other Topics Concern  . Not on file  Social History Narrative  . Not on file    Family History:   The patient's family history includes Alcohol abuse in her father; Arthritis in her father, mother, and sister; Cancer in her brother; Depression in her mother; Diabetes in her maternal grandfather; Heart disease in her maternal grandmother;  Hypertension in her maternal grandmother, paternal grandmother, and sister; Lupus in her sister; Transient ischemic attack in her sister.  ROS:  Please see the history of present illness.  All other ROS reviewed and negative.     Physical Exam/Data:   Vitals:   11/12/17 1200 11/12/17 1245 11/12/17 1330 11/12/17 1415  BP: 123/65 116/63 128/65 135/67  Pulse: 73 80 81 74  Resp: 18 17 15 10   SpO2: 99% 97% 98% 99%  Weight:      Height:        Intake/Output Summary (Last 24 hours) at 11/12/2017 1447 Last data filed at 11/12/2017 1435 Gross per 24 hour  Intake 40.29 ml  Output -  Net 40.29 ml   Filed Weights   11/12/17 0858  Weight: 200 lb (90.7 kg)   Body mass index is 36.58 kg/m.  General: Well developed, well nourished AAF, in no acute distress. Head: Normocephalic, atraumatic, sclera non-icteric, no xanthomas, nares are without discharge.  Neck: Negative for carotid bruits. JVD not elevated. Lungs: Clear bilaterally to auscultation without wheezes, rales, or rhonchi. Breathing is unlabored. Heart: RRR with S1 S2. No murmurs, rubs, or gallops appreciated. Abdomen: Soft, non-tender, non-distended with normoactive bowel sounds. No hepatomegaly. No rebound/guarding. No obvious abdominal masses. Msk:  Strength and tone appear normal for age. Extremities: No clubbing or cyanosis. No edema.  Distal pedal pulses are 2+ and equal bilaterally. Neuro: Alert and  oriented X 3. No facial asymmetry. No focal deficit. Moves all extremities spontaneously. Psych:  Responds to questions appropriately with a normal affect.  EKG:  The EKG was personally reviewed - demonstrates NSR 74bpm with baseline wander and poor quality so unreliable for interpretation. There appear to possible inferior ST changes but given the baseline wander they vary in each beat. There is possible ST upsloping in V5-V6 but again only seen in the first beat, suggesting this is related to artifact. The two EKGs scanned in this AM are the same tracing.   Relevant CV Studies: Remote cath as above.  2d echo pending.  Laboratory Data:  Chemistry Recent Labs  Lab 11/12/17 0900 11/12/17 0913  NA 141 141  K 4.0 4.0  CL 108 106  CO2 25  --   GLUCOSE 101* 98  BUN 13 16  CREATININE 1.12* 1.00  CALCIUM 9.7  --   GFRNONAA 50*  --   GFRAA 58*  --   ANIONGAP 8  --     Recent Labs  Lab 11/12/17 0900  PROT 7.6  ALBUMIN 3.8  AST 29  ALT 24  ALKPHOS 78  BILITOT 0.8   Hematology Recent Labs  Lab 11/12/17 0900 11/12/17 0913  WBC 5.6  --   RBC 4.84  --   HGB 13.1 14.3  HCT 42.7 42.0  MCV 88.2  --   MCH 27.1  --   MCHC 30.7  --   RDW 12.9  --   PLT 207  --    Cardiac EnzymesNo results for input(s): TROPONINI in the last 168 hours.  Recent Labs  Lab 11/12/17 0911  TROPIPOC 0.01    BNPNo results for input(s): BNP, PROBNP in the last 168 hours.  DDimer No results for input(s): DDIMER in the last 168 hours.  Radiology/Studies:  Ct Angio Head W Or Wo Contrast  Result Date: 11/12/2017 CLINICAL DATA:  Syncopal episode, now unresponsive. EXAM: CT ANGIOGRAPHY HEAD AND NECK TECHNIQUE: Multidetector CT imaging of the head and neck was performed using the standard protocol during bolus  administration of intravenous contrast. Multiplanar CT image reconstructions and MIPs were obtained to evaluate the vascular anatomy. Carotid stenosis measurements (when applicable) are obtained  utilizing NASCET criteria, using the distal internal carotid diameter as the denominator. CONTRAST:  27mL ISOVUE-370 IOPAMIDOL (ISOVUE-370) INJECTION 76% COMPARISON:  Code stroke CT earlier in the day. FINDINGS: CTA NECK FINDINGS Aortic arch: Standard branching. Imaged portion shows no evidence of aneurysm or dissection. No significant stenosis of the major arch vessel origins. Dolichoectasia. Minor atheromatous change. Right carotid system: No evidence of dissection, stenosis (50% or greater) or occlusion. Left carotid system: No evidence of dissection, stenosis (50% or greater) or occlusion. Vertebral arteries: LEFT vertebral slightly dominant, both patent. No evidence dissection, stenosis, or occlusion. No significant ostial narrowing or proximal subclavian disease. Skeleton: Mild spondylosis. No worrisome osseous lesion. No neck masses. Airway patent. Other neck: No masses. Upper chest: None. Review of the MIP images confirms the above findings CTA HEAD FINDINGS Anterior circulation: No significant stenosis, proximal occlusion, aneurysm, or vascular malformation. Posterior circulation: No significant stenosis, proximal occlusion, aneurysm, or vascular malformation. Both vertebrals contribute to formation of the basilar. Venous sinuses: As permitted by contrast timing, patent. Anatomic variants: None of significance. Delayed phase: Not performed. Review of the MIP images confirms the above findings IMPRESSION: No extracranial or intracranial flow-limiting stenosis, dissection, or occlusion. With regard to the clinical picture of syncopal episode followed by unresponsiveness, the basilar artery specifically, and its major branches, appear widely patent. Electronically Signed   By: Staci Righter M.D.   On: 11/12/2017 09:38   Ct Angio Neck W Or Wo Contrast  Result Date: 11/12/2017 CLINICAL DATA:  Syncopal episode, now unresponsive. EXAM: CT ANGIOGRAPHY HEAD AND NECK TECHNIQUE: Multidetector CT imaging of the  head and neck was performed using the standard protocol during bolus administration of intravenous contrast. Multiplanar CT image reconstructions and MIPs were obtained to evaluate the vascular anatomy. Carotid stenosis measurements (when applicable) are obtained utilizing NASCET criteria, using the distal internal carotid diameter as the denominator. CONTRAST:  22mL ISOVUE-370 IOPAMIDOL (ISOVUE-370) INJECTION 76% COMPARISON:  Code stroke CT earlier in the day. FINDINGS: CTA NECK FINDINGS Aortic arch: Standard branching. Imaged portion shows no evidence of aneurysm or dissection. No significant stenosis of the major arch vessel origins. Dolichoectasia. Minor atheromatous change. Right carotid system: No evidence of dissection, stenosis (50% or greater) or occlusion. Left carotid system: No evidence of dissection, stenosis (50% or greater) or occlusion. Vertebral arteries: LEFT vertebral slightly dominant, both patent. No evidence dissection, stenosis, or occlusion. No significant ostial narrowing or proximal subclavian disease. Skeleton: Mild spondylosis. No worrisome osseous lesion. No neck masses. Airway patent. Other neck: No masses. Upper chest: None. Review of the MIP images confirms the above findings CTA HEAD FINDINGS Anterior circulation: No significant stenosis, proximal occlusion, aneurysm, or vascular malformation. Posterior circulation: No significant stenosis, proximal occlusion, aneurysm, or vascular malformation. Both vertebrals contribute to formation of the basilar. Venous sinuses: As permitted by contrast timing, patent. Anatomic variants: None of significance. Delayed phase: Not performed. Review of the MIP images confirms the above findings IMPRESSION: No extracranial or intracranial flow-limiting stenosis, dissection, or occlusion. With regard to the clinical picture of syncopal episode followed by unresponsiveness, the basilar artery specifically, and its major branches, appear widely patent.  Electronically Signed   By: Staci Righter M.D.   On: 11/12/2017 09:38   Mr Brain Wo Contrast  Result Date: 11/12/2017 CLINICAL DATA:  Syncope, fall.  Neurologic deficit. EXAM: MRI HEAD WITHOUT CONTRAST TECHNIQUE:  Multiplanar, multiecho pulse sequences of the brain and surrounding structures were obtained without intravenous contrast. COMPARISON:  CTA 11/12/2017 FINDINGS: Limited imaging with diffusion-weighted imaging and axial FLAIR imaging only. Mild atrophy. Negative for hydrocephalus. Chronic microvascular ischemic changes in the white matter. Mild chronic ischemia in the pons Negative for acute infarct.  No midline shift. IMPRESSION: Negative for acute infarct. Atrophy and chronic microvascular ischemic changes in the white matter. Electronically Signed   By: Franchot Gallo M.D.   On: 11/12/2017 10:10   Ct Head Code Stroke Wo Contrast  Result Date: 11/12/2017 CLINICAL DATA:  Code stroke. Altered mental status. Last seen normal 1 hour ago. Witnessed syncopal episode. Previous fall 2 weeks ago. EXAM: CT HEAD WITHOUT CONTRAST TECHNIQUE: Contiguous axial images were obtained from the base of the skull through the vertex without intravenous contrast. COMPARISON:  10/23/2017. FINDINGS: Brain: No evidence for acute infarction, hemorrhage, mass lesion, hydrocephalus, or extra-axial fluid. Mild atrophy. Hypoattenuation of white matter, likely small vessel disease. Vascular: No hyperdense vessel or unexpected calcification. Skull: Calvarium intact. Sinuses/Orbits: Negative. Other: None. ASPECTS [facility] Stroke Program Early CT Score) - Ganglionic level infarction (caudate, lentiform nuclei, internal capsule, insula, M1-M3 cortex): 7 - Supraganglionic infarction (M4-M6 cortex): 3 Total score (0-10 with 10 being normal): 10 IMPRESSION: 1. Atrophy and small vessel disease. No acute intracranial findings. 2. ASPECTS is 10. These results were communicated to Dr. Lorraine Lax at 9:19 amon 6/21/2019by text page via the [facility]  messaging system. Electronically Signed   By: Staci Righter M.D.   On: 11/12/2017 09:21    Assessment and Plan:   1. Altered mental status of unclear etiology 2. Possibly abnormal EKG 3. Multiple sclerosis 4. Mild renal insufficiency without clear baseline  Symptoms are very atypical for cardiac pathology. She felt poorly with unusual symptoms preceding the event. EMS did not detect any arrhythmia despite persistent symptoms while she was in the emergency room. Neurology initial note raised question of psychogenic etiology if workup was unremarkable. I am not sure if this could relate to her prior diagnosis of multiple sclerosis but will defer to neurology. Initial 12 lead EKG shows possible ST changes but is poor quality due to baseline wander and the ST changes vary from beat to beat. She is currently undergoing EEG but I've asked the nurse to repeat this as soon as EEG is complete. Agree that echocardiogram is reasonable. Would monitor on telemetry for the duration of her hospital stay. I will review further workup with Dr. Ellyn Hack.  Addendum: repeat EKG showed lead reversal and baseline wander. Definitive repeat requested which was normal. Echo normal LVEF. Doubt further cardiac eval aside from tele monitoring. Dr. Ellyn Hack plans to see this PM.  For questions or updates, please contact [place] Please consult [email or web address] for contact info under Cardiology/STEMI.    Signed, Charlie Pitter, PA-C  11/12/2017 2:47 PM

## 2017-11-12 NOTE — Progress Notes (Signed)
Received report on pt.

## 2017-11-12 NOTE — ED Notes (Signed)
IP bed unassigned. EEG coming to ED

## 2017-11-12 NOTE — Progress Notes (Signed)
Repeat EKG shows lead reversal and continued baseline wander in the leads we need to reassess. Called nurse requesting repeat.   Adolf Ormiston PA-C

## 2017-11-12 NOTE — ED Triage Notes (Signed)
PT had a fall two weeks ago.  PT had a witnessed syncopal episode this morning, son called EMS, was advised to start compressions which he did. Fire arrived shortly and compressions were stopped, PT was breathing and had a pulse. PT mumbles and moves eyelids to voice. Is otherwise altered. Will not squeeze hands. Pupils small, but equal.

## 2017-11-12 NOTE — ED Notes (Signed)
Pt now following minimal commands, saying words like "ow" and "what happened."  PT following finger and nurse with gaze to right and left.

## 2017-11-12 NOTE — Progress Notes (Signed)
ANTICOAGULATION CONSULT NOTE - Initial Consult  Pharmacy Consult for heparin Indication: chest pain/ACS  No Known Allergies  Patient Measurements: Height: 5\' 2"  (157.5 cm) Weight: 200 lb (90.7 kg) IBW/kg (Calculated) : 50.1 Heparin Dosing Weight: 71.1kg  Vital Signs: BP: 116/63 (06/21 1245) Pulse Rate: 80 (06/21 1245)  Labs: Recent Labs    11/12/17 0900 11/12/17 0913  HGB 13.1 14.3  HCT 42.7 42.0  PLT 207  --   APTT 27  --   LABPROT 13.5  --   INR 1.04  --   CREATININE 1.12* 1.00    Estimated Creatinine Clearance: 57.1 mL/min (by C-G formula based on SCr of 1 mg/dL).   Medical History: Past Medical History:  Diagnosis Date  . Asthma   . Depression   . GERD (gastroesophageal reflux disease)   . Glaucoma   . Hepatitis C   . Multiple sclerosis ([place])   . Rheumatoid arthritis ([facility])     Medications:  Infusions:  . heparin      Assessment: 74 yof presented to the ED s/p syncope. A Code Stroke was called but MRI reported as negative for acute infarct. To start IV heparin for r/o ACS due to syncope with EKG changed. She is not on anticoagulation PTA. Baseline CBC is WNL.   Goal of Therapy:  Heparin level 0.3-0.7 units/ml Monitor platelets by anticoagulation protocol: Yes   Plan:  Heparin bolus 4000 units IV x 1  Heparin gtt 900 units/hr Check a 6 hr heparin level Daily heparin level and CBC  Maeleigh Buschman, Rande Lawman 11/12/2017,2:08 PM

## 2017-11-12 NOTE — ED Notes (Signed)
Pt currently getting EEG.

## 2017-11-12 NOTE — Consult Note (Addendum)
Requesting Physician: Dr. Lacinda Axon    Chief Complaint: AMS  History obtained from:  chart  HPI:                                                                                                                                         Deborah Reid is an 67 y.o. female Depression, MS, RA and glaucoma. 2 weeks ago she has a history of a fall per note: " Patient states she leaned over to kill a spider when she fell forward and hit her ahead on the ground.  No loss of consciousness.  She was able to get herself up. She suffered hematoma on her head but CT was negative."  Today patient was brought to the hospital via EMS after family noted a syncopal episode. They we advised to start compressions per 911. Upon arrival of EMS compressions we stopped due to pule noted. patient was noted to be mumbling and not following commands.  While in CT patient continued to muble follow no commands but withdraw from pain. She held her jaw clenched tight and at time would open eyes and other times hold eyelids shut. MRI was obtained and was negative.   Unfortunately I have checked the room in the waiting room and family members are not present  Date last known well: Date: 11/12/2017 Time last known well: Time: 08:20 tPA Given: No: negative MRI and normal CTA NIHSS 21 Modified Rankin: Rankin Score=0   Past Medical History:  Diagnosis Date  . Asthma   . Depression   . GERD (gastroesophageal reflux disease)   . Glaucoma   . Hepatitis C   . Multiple sclerosis ([place])   . Rheumatoid arthritis [facility])     Past Surgical History:  Procedure Laterality Date  . CHOLECYSTECTOMY      Family History  Problem Relation Age of Onset  . Arthritis Mother   . Depression Mother   . Alcohol abuse Father   . Arthritis Father   . Transient ischemic attack Sister   . Cancer Brother        prostate cancer  . Arthritis Sister   . Lupus Sister   . Hypertension Sister   . Heart disease Maternal Grandmother   . Hypertension  Maternal Grandmother   . Diabetes Maternal Grandfather   . Hypertension Paternal Grandmother        Social History:  reports that she quit smoking about 44 years ago. Her smoking use included cigarettes. She has never used smokeless tobacco. She reports that she does not drink alcohol or use drugs.  Allergies: No Known Allergies  Medications:  Current Facility-Administered Medications  Medication Dose Route Frequency Provider Last Rate Last Dose  . iopamidol (ISOVUE-370) 76 % injection            Current Outpatient Medications  Medication Sig Dispense Refill  . cholecalciferol (VITAMIN D) 1000 UNITS tablet Take 1 tablet (1,000 Units total) by mouth daily. 30 tablet 6  . escitalopram (LEXAPRO) 10 MG tablet Take 1 tablet (10 mg total) by mouth at bedtime. 30 tablet 5  . methocarbamol (ROBAXIN) 500 MG tablet Take 1 tablet (500 mg total) by mouth 2 (two) times daily. 20 tablet 0  . Multiple Vitamin (MULTIVITAMIN) tablet Take 1 tablet by mouth daily.    . traZODone (DESYREL) 50 MG tablet Take 1 tablet (50 mg total) by mouth at bedtime. 30 tablet 5  . vitamin C (ASCORBIC ACID) 500 MG tablet Take 500 mg by mouth daily.       ROS:                                                                                                                                       History obtained from unobtainable from patient due to mental status    General Examination:                                                                                                      Blood pressure (!) 153/67, pulse 73, resp. rate 16, weight 90.7 kg (200 lb), SpO2 100 %.  HEENT-  Normocephalic, no lesions, without obvious abnormality.  Normal external eye and conjunctiva.   Extremities- Warm, dry and intact Musculoskeletal-no joint tenderness, deformity or swelling Skin-warm and dry, no  hyperpigmentation, vitiligo, or suspicious lesions  Neurological Examination Mental Status: Alert, only speaks minimally.  At this time she is able to say ouch, and was stuttering speech stated what happened.  She follows minimal commands only lifting her arms looking left to right.  She does track me in the room from left to right and tracks me when I am doing something.  I do not notice any a aphasia or dysarthria when she speaks Cranial Nerves: II: Blinks to threat bilaterally III,IV, VI: ptosis not present, extra-ocular motions intact bilaterally, pupils equal, round, reactive to light and accommodation V,VII: Face symmetric, facial light touch sensation normal bilaterally VIII: hearing normal bilaterally IX,X: uvula rises symmetrically XI: bilateral shoulder shrug XII: midline tongue extension Motor: Moving all extremities antigravity with a positive Hoover's.  Sensory: Withdraws from noxious stimuli all 4  extremities or says ouch when applied Deep Tendon Reflexes: 2+ and symmetric throughout Plantars: Right: downgoing   Left: downgoing Cerebellar: Not take part in finger-nose Gait: Not tested   Lab Results: Basic Metabolic Panel: Recent Labs  Lab 11/12/17 0913  NA 141  K 4.0  CL 106  GLUCOSE 98  BUN 16  CREATININE 1.00    CBC: Recent Labs  Lab 11/12/17 0913  HGB 14.3  HCT 42.0    Lipid Panel: No results for input(s): CHOL, TRIG, HDL, CHOLHDL, VLDL, LDLCALC in the last 168 hours.  CBG: No results for input(s): GLUCAP in the last 168 hours.  Imaging: Ct Angio Head W Or Wo Contrast  Result Date: 11/12/2017 CLINICAL DATA:  Syncopal episode, now unresponsive. EXAM: CT ANGIOGRAPHY HEAD AND NECK TECHNIQUE: Multidetector CT imaging of the head and neck was performed using the standard protocol during bolus administration of intravenous contrast. Multiplanar CT image reconstructions and MIPs were obtained to evaluate the vascular anatomy. Carotid stenosis  measurements (when applicable) are obtained utilizing NASCET criteria, using the distal internal carotid diameter as the denominator. CONTRAST:  59mL ISOVUE-370 IOPAMIDOL (ISOVUE-370) INJECTION 76% COMPARISON:  Code stroke CT earlier in the day. FINDINGS: CTA NECK FINDINGS Aortic arch: Standard branching. Imaged portion shows no evidence of aneurysm or dissection. No significant stenosis of the major arch vessel origins. Dolichoectasia. Minor atheromatous change. Right carotid system: No evidence of dissection, stenosis (50% or greater) or occlusion. Left carotid system: No evidence of dissection, stenosis (50% or greater) or occlusion. Vertebral arteries: LEFT vertebral slightly dominant, both patent. No evidence dissection, stenosis, or occlusion. No significant ostial narrowing or proximal subclavian disease. Skeleton: Mild spondylosis. No worrisome osseous lesion. No neck masses. Airway patent. Other neck: No masses. Upper chest: None. Review of the MIP images confirms the above findings CTA HEAD FINDINGS Anterior circulation: No significant stenosis, proximal occlusion, aneurysm, or vascular malformation. Posterior circulation: No significant stenosis, proximal occlusion, aneurysm, or vascular malformation. Both vertebrals contribute to formation of the basilar. Venous sinuses: As permitted by contrast timing, patent. Anatomic variants: None of significance. Delayed phase: Not performed. Review of the MIP images confirms the above findings IMPRESSION: No extracranial or intracranial flow-limiting stenosis, dissection, or occlusion. With regard to the clinical picture of syncopal episode followed by unresponsiveness, the basilar artery specifically, and its major branches, appear widely patent. Electronically Signed   By: Deborah Reid M.D.   On: 11/12/2017 09:38   Ct Angio Neck W Or Wo Contrast  Result Date: 11/12/2017 CLINICAL DATA:  Syncopal episode, now unresponsive. EXAM: CT ANGIOGRAPHY HEAD AND NECK  TECHNIQUE: Multidetector CT imaging of the head and neck was performed using the standard protocol during bolus administration of intravenous contrast. Multiplanar CT image reconstructions and MIPs were obtained to evaluate the vascular anatomy. Carotid stenosis measurements (when applicable) are obtained utilizing NASCET criteria, using the distal internal carotid diameter as the denominator. CONTRAST:  59mL ISOVUE-370 IOPAMIDOL (ISOVUE-370) INJECTION 76% COMPARISON:  Code stroke CT earlier in the day. FINDINGS: CTA NECK FINDINGS Aortic arch: Standard branching. Imaged portion shows no evidence of aneurysm or dissection. No significant stenosis of the major arch vessel origins. Dolichoectasia. Minor atheromatous change. Right carotid system: No evidence of dissection, stenosis (50% or greater) or occlusion. Left carotid system: No evidence of dissection, stenosis (50% or greater) or occlusion. Vertebral arteries: LEFT vertebral slightly dominant, both patent. No evidence dissection, stenosis, or occlusion. No significant ostial narrowing or proximal subclavian disease. Skeleton: Mild spondylosis. No worrisome osseous lesion. No  neck masses. Airway patent. Other neck: No masses. Upper chest: None. Review of the MIP images confirms the above findings CTA HEAD FINDINGS Anterior circulation: No significant stenosis, proximal occlusion, aneurysm, or vascular malformation. Posterior circulation: No significant stenosis, proximal occlusion, aneurysm, or vascular malformation. Both vertebrals contribute to formation of the basilar. Venous sinuses: As permitted by contrast timing, patent. Anatomic variants: None of significance. Delayed phase: Not performed. Review of the MIP images confirms the above findings IMPRESSION: No extracranial or intracranial flow-limiting stenosis, dissection, or occlusion. With regard to the clinical picture of syncopal episode followed by unresponsiveness, the basilar artery specifically, and  its major branches, appear widely patent. Electronically Signed   By: Deborah Reid M.D.   On: 11/12/2017 09:38   Ct Head Code Stroke Wo Contrast  Result Date: 11/12/2017 CLINICAL DATA:  Code stroke. Altered mental status. Last seen normal 1 hour ago. Witnessed syncopal episode. Previous fall 2 weeks ago. EXAM: CT HEAD WITHOUT CONTRAST TECHNIQUE: Contiguous axial images were obtained from the base of the skull through the vertex without intravenous contrast. COMPARISON:  10/23/2017. FINDINGS: Brain: No evidence for acute infarction, hemorrhage, mass lesion, hydrocephalus, or extra-axial fluid. Mild atrophy. Hypoattenuation of white matter, likely small vessel disease. Vascular: No hyperdense vessel or unexpected calcification. Skull: Calvarium intact. Sinuses/Orbits: Negative. Other: None. ASPECTS [facility] Stroke Program Early CT Score) - Ganglionic level infarction (caudate, lentiform nuclei, internal capsule, insula, M1-M3 cortex): 7 - Supraganglionic infarction (M4-M6 cortex): 3 Total score (0-10 with 10 being normal): 10 IMPRESSION: 1. Atrophy and small vessel disease. No acute intracranial findings. 2. ASPECTS is 10. These results were communicated to Dr. Lorraine Reid at 9:19 amon 6/21/2019by text page via the [facility] messaging system. Electronically Signed   By: Deborah Reid M.D.   On: 11/12/2017 09:21    Assessment and plan discussed with with attending physician and they are in agreement.    Deborah Quill PA-C [facility] [phone]  11/12/2017, 9:41 AM   Assessment: 67 y.o. female presented to the emergency room via EMS.  Patient had a syncopal event at home in which they were prompted to start CPR.  This is only done for short period time until EMS arrived and pulse was noted.  On arrival patient was moaning and following no commands however as time went on and exam was reevaluated after diagnostic tests were done she was able to stutter with some speech and follow very minimal commands.   CT, CTA of head and neck, MRI were all done and negative.      Stroke Risk Factors - none  Recommend -If admitted PT OT -- Workup for syncopal episode -- No further stroke workup needed as her presentation is not a stroke.     NEUROHOSPITALIST ADDENDUM   I have reviewed the contents of history and physical exam as documented by PA/ARNP/Resident and agree with above documentation.  I have discussed and formulated the above plan as documented. Edits to the note have been made as needed.  Seen and examined the patient today.  39 F with PMH diagnosis of MS( not on medication), obesity, rheumatoid arthritis presents after a stroke alert after syncopal episode.   On arrival patient nonverbal however alert.  No focal deficits noted appear weak in all 4 extremities.  CT head was performed which is negative.  CTA was performed to rule out basilar thrombus which was also negative.  He did not receive TPA as we did not think this is a stroke and MRI confirmed this.  Stroke alert was canceled.  Patient subsequently improved to stuttering/mumbling of words.  I spoke to the nurse taking care of her who says she is back to her baseline currently at 7 PM.  her presentation was likely due to anxiety in the setting of being brought to the hospital after syncopal episode.  Etiology of syncopal event still needs evaluation- unlikely to be a seizure.  EEG is normal. Recommend medicine  team to evaluate for syncope.   Neurology will sign off.     Karena Addison Aroor MD [facility] [phone]   If 7pm to 7am, please call on call as listed on AMION.

## 2017-11-12 NOTE — ED Notes (Signed)
ED Provider at bedside. 

## 2017-11-12 NOTE — Progress Notes (Signed)
  Echocardiogram 2D Echocardiogram has been performed.  Merrie Roof F 11/12/2017, 3:57 PM

## 2017-11-12 NOTE — ED Notes (Signed)
Dr. Lacinda Axon made aware of symptoms and onset time.

## 2017-11-12 NOTE — Procedures (Signed)
Date of recording 11/12/2017  Referring Provider Erin Hearing  Technical Digital EEG recording using 10-20 international electrode system  Reason for the study Altered mental status  Description of the recording When awake posterior dominant rhythm is 8-9 bilateral reactive,  Sleep architecture was not seen. Epileptiform features were not seen during this recording.  Impression The EEG is normal in awake state only.

## 2017-11-12 NOTE — Progress Notes (Signed)
EEG completed; results pending.    

## 2017-11-12 NOTE — ED Notes (Signed)
Returns from MRI. Neuro PA at bedside  Family no longer in room

## 2017-11-12 NOTE — Code Documentation (Signed)
67yo female arriving to [facility] via [place] at 13. Patient noted to have a syncopal episode at 0800 witnessed by family. Family started CPR which was stopped when [place] arrived and noted patient to have a pulse and to be breathing. Code stroke called on arrival to the ED. Stroke team to CT. CT completed with no acute findings followed by CTA which was negative for LVO. NIHSS 21, see documentation for details and code stroke times. Patient mute, not following commands with generalized weakness on exam. Patient able to hold bilateral arms up without hitting her face. No gaze and blinks to threat bilaterally. Patient to STAT MRI with DWI negative. Code stroke canceled. Bedside handoff with ED RN Baldo Ash.

## 2017-11-13 ENCOUNTER — Observation Stay (HOSPITAL_COMMUNITY): Payer: Medicare HMO

## 2017-11-13 DIAGNOSIS — K219 Gastro-esophageal reflux disease without esophagitis: Secondary | ICD-10-CM | POA: Diagnosis not present

## 2017-11-13 DIAGNOSIS — R4020 Unspecified coma: Secondary | ICD-10-CM | POA: Diagnosis not present

## 2017-11-13 DIAGNOSIS — F329 Major depressive disorder, single episode, unspecified: Secondary | ICD-10-CM | POA: Diagnosis not present

## 2017-11-13 DIAGNOSIS — R55 Syncope and collapse: Secondary | ICD-10-CM | POA: Diagnosis not present

## 2017-11-13 LAB — GLUCOSE, CAPILLARY: Glucose-Capillary: 101 mg/dL — ABNORMAL HIGH (ref 65–99)

## 2017-11-13 LAB — CBC
HCT: 41.1 % (ref 36.0–46.0)
HEMOGLOBIN: 12.6 g/dL (ref 12.0–15.0)
MCH: 27.2 pg (ref 26.0–34.0)
MCHC: 30.7 g/dL (ref 30.0–36.0)
MCV: 88.6 fL (ref 78.0–100.0)
Platelets: 204 10*3/uL (ref 150–400)
RBC: 4.64 MIL/uL (ref 3.87–5.11)
RDW: 13.2 % (ref 11.5–15.5)
WBC: 5.5 10*3/uL (ref 4.0–10.5)

## 2017-11-13 LAB — HEPARIN LEVEL (UNFRACTIONATED)
Heparin Unfractionated: 1.22 IU/mL — ABNORMAL HIGH (ref 0.30–0.70)
Heparin Unfractionated: 0.55 IU/mL (ref 0.30–0.70)

## 2017-11-13 MED ORDER — HEPARIN (PORCINE) IN NACL 100-0.45 UNIT/ML-% IJ SOLN
550.0000 [IU]/h | INTRAMUSCULAR | Status: DC
  Administered 2017-11-13: 550 [IU]/h via INTRAVENOUS

## 2017-11-13 NOTE — Progress Notes (Signed)
PROGRESS NOTE    Deborah Reid  PJK:[id number] DOB: [birth year] DOA: 11/12/2017 PCP: Sherryl Barters, NP    Brief Narrative:   Deborah Reid is a 67 y.o. female with a hx of multiple sclerosis, severe depression, hepatitis C, asthma, glaucoma, RA, normal coronaries in 02/2003 who is being seen today for the evaluation of possible syncope/abnormal EKG.   Assessment & Plan:   Principal Problem:   Syncope Active Problems:   Hepatitis C   Rheumatoid arthritis (HCC)   GERD (gastroesophageal reflux disease)   Depressive disorder   Syncope: Differential include arrhythmias vs neurogenic syncope vs vasovagal vs orthostatic hypotension.  Stroke ruled out WITH CT angio of the head and neck, MRI brain.  Echo : Systolic function was vigorous. The estimated ejection fraction was in the range of 65%   to 70%. Wall motion was normal; there were no regional wall motion abnormalities. Doppler parameters are consistent with abnormal left ventricular relaxation (grade 1 diastolic  dysfunction). troponin's negative.  EKG on admission slightly abnormal. Repeat EKG wnl.  No arrhythmias on telemetry.  Cardiology consulted and recommended no further cardiac work up.  Suspect due to her symptoms its probably vaso vagal syncope vs orthostatics.  EEG is negative for seizures.  Check orthostatic vital signs in am.     Depression:  Pt was on psychiatry meds at home, which she is not taking at home.  Will need to restart her meds.   GERD on protonix and stable.    AKI: Probably from dehydration.  Improved with fluids.    DVT prophylaxis: heparin.  Code Status: full code.  Family Communication: FAMILY AT BEDSIDE Disposition Plan: discharge to SNF.    Consultants:   Cardiology    Procedures: echocardiogram.  EEG Antimicrobials:none.   Subjective: No chest pain or sob. No nausea, vomiting or abd pain.   Objective: Vitals:   11/13/17 0317 11/13/17 0515 11/13/17 0900 11/13/17 1545   BP: (!) 126/59 129/62  (!) 107/52  Pulse: 70 71  75  Resp: 16 17  20   Temp: 98 F (36.7 C) 97.7 F (36.5 C)  98.1 F (36.7 C)  TempSrc: Oral Oral  Oral  SpO2: 98% 98%  98%  Weight:   83.1 kg (183 lb 3.2 oz)   Height:   5\' 2"  (1.575 m)     Intake/Output Summary (Last 24 hours) at 11/13/2017 1648 Last data filed at 11/13/2017 1238 Gross per 24 hour  Intake 116.25 ml  Output 1 ml  Net 115.25 ml   Filed Weights   11/12/17 0858 11/13/17 0900  Weight: 90.7 kg (200 lb) 83.1 kg (183 lb 3.2 oz)    Examination:  General exam: Appears calm and comfortable  Respiratory system: Clear to auscultation. Respiratory effort normal. Cardiovascular system: S1 & S2 heard, RRR. No JVD, murmurs,  No pedal edema. Gastrointestinal system: Abdomen is nondistended, soft and nontender. No organomegaly or masses felt. Normal bowel sounds heard. Central nervous system: Alert and oriented. No focal neurological deficits. Extremities: Symmetric 5 x 5 power. Skin: No rashes, lesions or ulcers Psychiatry:  Mood & affect appropriate.     Data Reviewed: I have personally reviewed following labs and imaging studies  CBC: Recent Labs  Lab 11/12/17 0900 11/12/17 0913 11/13/17 0722  WBC 5.6  --  5.5  NEUTROABS 2.4  --   --   HGB 13.1 14.3 12.6  HCT 42.7 42.0 41.1  MCV 88.2  --  88.6  PLT 207  --  433   Basic Metabolic Panel: Recent Labs  Lab 11/12/17 0900 11/12/17 0913  NA 141 141  K 4.0 4.0  CL 108 106  CO2 25  --   GLUCOSE 101* 98  BUN 13 16  CREATININE 1.12* 1.00  CALCIUM 9.7  --    GFR: Estimated Creatinine Clearance: 54.6 mL/min (by C-G formula based on SCr of 1 mg/dL). Liver Function Tests: Recent Labs  Lab 11/12/17 0900  AST 29  ALT 24  ALKPHOS 78  BILITOT 0.8  PROT 7.6  ALBUMIN 3.8   No results for input(s): LIPASE, AMYLASE in the last 168 hours. No results for input(s): AMMONIA in the last 168 hours. Coagulation Profile: Recent Labs  Lab 11/12/17 0900  INR 1.04     Cardiac Enzymes: No results for input(s): CKTOTAL, CKMB, CKMBINDEX, TROPONINI in the last 168 hours. BNP (last 3 results) No results for input(s): PROBNP in the last 8760 hours. HbA1C: No results for input(s): HGBA1C in the last 72 hours. CBG: Recent Labs  Lab 11/13/17 0131  GLUCAP 101*   Lipid Profile: No results for input(s): CHOL, HDL, LDLCALC, TRIG, CHOLHDL, LDLDIRECT in the last 72 hours. Thyroid Function Tests: No results for input(s): TSH, T4TOTAL, FREET4, T3FREE, THYROIDAB in the last 72 hours. Anemia Panel: No results for input(s): VITAMINB12, FOLATE, FERRITIN, TIBC, IRON, RETICCTPCT in the last 72 hours. Sepsis Labs: No results for input(s): PROCALCITON, LATICACIDVEN in the last 168 hours.  No results found for this or any previous visit (from the past 240 hour(s)).       Radiology Studies: Ct Angio Head W Or Wo Contrast  Result Date: 11/12/2017 CLINICAL DATA:  Syncopal episode, now unresponsive. EXAM: CT ANGIOGRAPHY HEAD AND NECK TECHNIQUE: Multidetector CT imaging of the head and neck was performed using the standard protocol during bolus administration of intravenous contrast. Multiplanar CT image reconstructions and MIPs were obtained to evaluate the vascular anatomy. Carotid stenosis measurements (when applicable) are obtained utilizing NASCET criteria, using the distal internal carotid diameter as the denominator. CONTRAST:  77mL ISOVUE-370 IOPAMIDOL (ISOVUE-370) INJECTION 76% COMPARISON:  Code stroke CT earlier in the day. FINDINGS: CTA NECK FINDINGS Aortic arch: Standard branching. Imaged portion shows no evidence of aneurysm or dissection. No significant stenosis of the major arch vessel origins. Dolichoectasia. Minor atheromatous change. Right carotid system: No evidence of dissection, stenosis (50% or greater) or occlusion. Left carotid system: No evidence of dissection, stenosis (50% or greater) or occlusion. Vertebral arteries: LEFT vertebral slightly  dominant, both patent. No evidence dissection, stenosis, or occlusion. No significant ostial narrowing or proximal subclavian disease. Skeleton: Mild spondylosis. No worrisome osseous lesion. No neck masses. Airway patent. Other neck: No masses. Upper chest: None. Review of the MIP images confirms the above findings CTA HEAD FINDINGS Anterior circulation: No significant stenosis, proximal occlusion, aneurysm, or vascular malformation. Posterior circulation: No significant stenosis, proximal occlusion, aneurysm, or vascular malformation. Both vertebrals contribute to formation of the basilar. Venous sinuses: As permitted by contrast timing, patent. Anatomic variants: None of significance. Delayed phase: Not performed. Review of the MIP images confirms the above findings IMPRESSION: No extracranial or intracranial flow-limiting stenosis, dissection, or occlusion. With regard to the clinical picture of syncopal episode followed by unresponsiveness, the basilar artery specifically, and its major branches, appear widely patent. Electronically Signed   By: Staci Righter M.D.   On: 11/12/2017 09:38   Ct Head Wo Contrast  Result Date: 11/13/2017 CLINICAL DATA:  Significant change in level of alertness and responsiveness as  compared to yesterday. EXAM: CT HEAD WITHOUT CONTRAST TECHNIQUE: Contiguous axial images were obtained from the base of the skull through the vertex without intravenous contrast. COMPARISON:  MRI brain 11/12/2017.  CT head 11/12/2017. FINDINGS: Brain: Mild diffuse cerebral atrophy. Mild ventricular dilatation consistent with central atrophy. Patchy low-attenuation changes in the deep white matter suggesting small vessel ischemia. MS or other white matter disease could also have this appearance. No evidence of acute infarction, hemorrhage, hydrocephalus, extra-axial collection or mass lesion/mass effect. Vascular: Mild intracranial arterial vascular calcifications are present. Skull: Normal. Negative  for fracture or focal lesion. Sinuses/Orbits: No acute finding. Other: None. IMPRESSION: No acute intracranial abnormalities. Chronic atrophy and patchy white matter changes are again demonstrated without change. Consider small vessel ischemic change versus MS plaques. Electronically Signed   By: Lucienne Capers M.D.   On: 11/13/2017 03:32   Ct Angio Neck W Or Wo Contrast  Result Date: 11/12/2017 CLINICAL DATA:  Syncopal episode, now unresponsive. EXAM: CT ANGIOGRAPHY HEAD AND NECK TECHNIQUE: Multidetector CT imaging of the head and neck was performed using the standard protocol during bolus administration of intravenous contrast. Multiplanar CT image reconstructions and MIPs were obtained to evaluate the vascular anatomy. Carotid stenosis measurements (when applicable) are obtained utilizing NASCET criteria, using the distal internal carotid diameter as the denominator. CONTRAST:  3mL ISOVUE-370 IOPAMIDOL (ISOVUE-370) INJECTION 76% COMPARISON:  Code stroke CT earlier in the day. FINDINGS: CTA NECK FINDINGS Aortic arch: Standard branching. Imaged portion shows no evidence of aneurysm or dissection. No significant stenosis of the major arch vessel origins. Dolichoectasia. Minor atheromatous change. Right carotid system: No evidence of dissection, stenosis (50% or greater) or occlusion. Left carotid system: No evidence of dissection, stenosis (50% or greater) or occlusion. Vertebral arteries: LEFT vertebral slightly dominant, both patent. No evidence dissection, stenosis, or occlusion. No significant ostial narrowing or proximal subclavian disease. Skeleton: Mild spondylosis. No worrisome osseous lesion. No neck masses. Airway patent. Other neck: No masses. Upper chest: None. Review of the MIP images confirms the above findings CTA HEAD FINDINGS Anterior circulation: No significant stenosis, proximal occlusion, aneurysm, or vascular malformation. Posterior circulation: No significant stenosis, proximal  occlusion, aneurysm, or vascular malformation. Both vertebrals contribute to formation of the basilar. Venous sinuses: As permitted by contrast timing, patent. Anatomic variants: None of significance. Delayed phase: Not performed. Review of the MIP images confirms the above findings IMPRESSION: No extracranial or intracranial flow-limiting stenosis, dissection, or occlusion. With regard to the clinical picture of syncopal episode followed by unresponsiveness, the basilar artery specifically, and its major branches, appear widely patent. Electronically Signed   By: Staci Righter M.D.   On: 11/12/2017 09:38   Mr Brain Wo Contrast  Result Date: 11/12/2017 CLINICAL DATA:  Syncope, fall.  Neurologic deficit. EXAM: MRI HEAD WITHOUT CONTRAST TECHNIQUE: Multiplanar, multiecho pulse sequences of the brain and surrounding structures were obtained without intravenous contrast. COMPARISON:  CTA 11/12/2017 FINDINGS: Limited imaging with diffusion-weighted imaging and axial FLAIR imaging only. Mild atrophy. Negative for hydrocephalus. Chronic microvascular ischemic changes in the white matter. Mild chronic ischemia in the pons Negative for acute infarct.  No midline shift. IMPRESSION: Negative for acute infarct. Atrophy and chronic microvascular ischemic changes in the white matter. Electronically Signed   By: Franchot Gallo M.D.   On: 11/12/2017 10:10   Ct Head Code Stroke Wo Contrast  Result Date: 11/12/2017 CLINICAL DATA:  Code stroke. Altered mental status. Last seen normal 1 hour ago. Witnessed syncopal episode. Previous fall 2 weeks ago. EXAM:  CT HEAD WITHOUT CONTRAST TECHNIQUE: Contiguous axial images were obtained from the base of the skull through the vertex without intravenous contrast. COMPARISON:  10/23/2017. FINDINGS: Brain: No evidence for acute infarction, hemorrhage, mass lesion, hydrocephalus, or extra-axial fluid. Mild atrophy. Hypoattenuation of white matter, likely small vessel disease. Vascular: No  hyperdense vessel or unexpected calcification. Skull: Calvarium intact. Sinuses/Orbits: Negative. Other: None. ASPECTS [facility] Stroke Program Early CT Score) - Ganglionic level infarction (caudate, lentiform nuclei, internal capsule, insula, M1-M3 cortex): 7 - Supraganglionic infarction (M4-M6 cortex): 3 Total score (0-10 with 10 being normal): 10 IMPRESSION: 1. Atrophy and small vessel disease. No acute intracranial findings. 2. ASPECTS is 10. These results were communicated to Dr. Lorraine Lax at 9:19 amon 6/21/2019by text page via the [facility] messaging system. Electronically Signed   By: Staci Righter M.D.   On: 11/12/2017 09:21        Scheduled Meds: . aspirin EC  325 mg Oral Daily  . pantoprazole  40 mg Oral Daily   Continuous Infusions:   LOS: 0 days    Time spent: 35 minutes.     Hosie Poisson, MD [facility] Pager [phone]  If 7PM-7AM, please contact night-coverage [email or web address] Password [facility] 11/13/2017, 4:48 PM

## 2017-11-13 NOTE — Significant Event (Addendum)
Rapid Response Event Note  Overview: Called to room d/t pt decreased LOC. Prior to this episode of note, pt was awake, alert, oriented, following commands, and moving all extremities. Time Called: 0130 Arrival Time: 0135 Event Type: Neurologic  Initial Focused Assessment: Pt laying in bed with eyes closed.  VS: HR-79, BP-135/66, RR 16, SpO2-94% on RA. CBG-101. PERRLA. Pt will grimace to painful stimuli and will attempt to open eyes and mouth with repeated stimuli, however, patient will not speak, or wake up for more than a few seconds.  Pt will squeeze hands and wiggle toes with repeated stimulation.  Pt had similar episode upon admission and all tests were negative at that time.  Interventions: [place], NP to bedside. CT head-no acute changes  Plan of Care (if not transferred): Continue to monitor pt. Call RRT if further assistance needed.. Event Summary: Name of Physician Notified: Kennon Holter, NP at [facility]    at    Outcome: Stayed in room and stabalized     [place], Carren Rang

## 2017-11-13 NOTE — Progress Notes (Signed)
Called to the bedside by rapid response and bedside nurse for AMS. According to RN, pt was complaining of feeling warm and back pain around 1 am and less than 10 minutes later she became unresponsive. At time of assessment, pt showed minimal response to verbal stimuli but responsive to painful stimuli, VSS, pupils pinpoint. No posturing or seizure activity noted at that time. Symptoms improved over the course of an hour but according to RN, pt still not at baseline.  Review of chart shows previous EEG and CT scan of the head on 06/21 are unremarkable. Neurology previously consulted on pt for ?stroke. Will perform additional imaging to r/o stroke.  AMS -CT scan of head ordered to r/o stroke though pt presentation does not appear to indicate that diagnosis. Pt has a history of MS, anxiety and depressive disorder. Symptoms more indicative of an acute exacerbation of MS. Will await CT results before further intervention.     Lovey Newcomer, NP [facility] [phone]-7a [phone]

## 2017-11-13 NOTE — Evaluation (Signed)
Physical Therapy Evaluation Patient Details Name: Deborah Reid MRN: [id number] DOB: 09/29/1950 Today's Date: 11/13/2017   History of Present Illness  67 yo female with onset of LOC with CPR initiated, dc by EMT's and was given a rapid response this AM for being unresponsive.  Now referred to PT for evaluation of mobility.  PMHx:  MS, recent fall with head injury, glaucoma, RA, Hep C, asthma, GERD,   Clinical Impression  Pt was able to assist to stand bedside but became extremely light headed and nauseated with effort.  Given her recent rapid response, PT got her back to bed and will try at another time to see how she tolerates this activity.  Nursing aware, had O2 sats that were 99% after effort to stand, and will continue to monitor her tolerance for activity.  Follow acutely for strengthening and balance/endurance standing toward gait.    Follow Up Recommendations SNF    Equipment Recommendations  Rolling walker with 5" wheels    Recommendations for Other Services       Precautions / Restrictions Precautions Precautions: Fall(telemetry, on O2 monitor) Restrictions Weight Bearing Restrictions: No      Mobility  Bed Mobility Overal bed mobility: Needs Assistance Bed Mobility: Supine to Sit;Sit to Supine     Supine to sit: Min assist;Mod assist Sit to supine: Min assist   General bed mobility comments: more help to support trunk OOB and used elevated HOB  Transfers Overall transfer level: Needs assistance Equipment used: Rolling walker (2 wheeled);1 person hand held assist Transfers: Sit to/from Stand Sit to Stand: Mod assist         General transfer comment: mod to power up from bed and to steady, not feeling well upon standing with dizziness and nausea  Ambulation/Gait             General Gait Details: could not take a step with unsteady legs upon standing  Stairs            Wheelchair Mobility    Modified Rankin (Stroke Patients Only)        Balance Overall balance assessment: Needs assistance Sitting-balance support: Feet supported Sitting balance-Leahy Scale: Fair     Standing balance support: Bilateral upper extremity supported;During functional activity Standing balance-Leahy Scale: Poor                               Pertinent Vitals/Pain Pain Assessment: No/denies pain    Home Living Family/patient expects to be discharged to:: Private residence Living Arrangements: Children Available Help at Discharge: Family;Available 24 hours/day Type of Home: House Home Access: Stairs to enter Entrance Stairs-Rails: Right;Left;Can reach both Entrance Stairs-Number of Steps: 1 Home Layout: One level Home Equipment: Walker - 4 wheels;Cane - single point(family states rollator has been too unsteady for her) Additional Comments: may need a regular RW    Prior Function Level of Independence: Independent with assistive device(s)               Hand Dominance        Extremity/Trunk Assessment   Upper Extremity Assessment Upper Extremity Assessment: Generalized weakness    Lower Extremity Assessment Lower Extremity Assessment: Generalized weakness    Cervical / Trunk Assessment Cervical / Trunk Assessment: Normal  Communication   Communication: No difficulties  Cognition Arousal/Alertness: Awake/alert Behavior During Therapy: WFL for tasks assessed/performed Overall Cognitive Status: Within Functional Limits for tasks assessed  General Comments General comments (skin integrity, edema, etc.): no signs of skin breakdown    Exercises     Assessment/Plan    PT Assessment Patient needs continued PT services  PT Problem List Decreased strength;Decreased range of motion;Decreased activity tolerance;Decreased balance;Decreased mobility;Decreased coordination;Decreased safety awareness;Cardiopulmonary status limiting activity       PT  Treatment Interventions DME instruction;Gait training;Stair training;Functional mobility training;Therapeutic activities;Therapeutic exercise;Balance training;Neuromuscular re-education;Patient/family education    PT Goals (Current goals can be found in the Care Plan section)  Acute Rehab PT Goals Patient Stated Goal: to get stronger and walk PT Goal Formulation: With patient/family Time For Goal Achievement: 11/27/17 Potential to Achieve Goals: Good    Frequency Min 2X/week   Barriers to discharge Inaccessible home environment has one step to enter house    Co-evaluation               AM-PAC PT "6 Clicks" Daily Activity  Outcome Measure Difficulty turning over in bed (including adjusting bedclothes, sheets and blankets)?: Unable Difficulty moving from lying on back to sitting on the side of the bed? : Unable Difficulty sitting down on and standing up from a chair with arms (e.g., wheelchair, bedside commode, etc,.)?: Unable Help needed moving to and from a bed to chair (including a wheelchair)?: A Lot Help needed walking in hospital room?: Total Help needed climbing 3-5 steps with a railing? : Total 6 Click Score: 7    End of Session Equipment Utilized During Treatment: Gait belt Activity Tolerance: Patient limited by fatigue Patient left: in bed;with call bell/phone within reach;with bed alarm set;with family/visitor present Nurse Communication: Mobility status PT Visit Diagnosis: Unsteadiness on feet (R26.81);Muscle weakness (generalized) (M62.81);Difficulty in walking, not elsewhere classified (R26.2)    Time: 1583-0940 PT Time Calculation (min) (ACUTE ONLY): 18 min   Charges:   PT Evaluation $PT Eval Moderate Complexity: 1 Mod     PT G Codes:   PT G-Codes **NOT FOR INPATIENT CLASS** Functional Assessment Tool Used: AM-PAC 6 Clicks Basic Mobility    Ramond Dial 11/13/2017, 1:45 PM   Mee Hives, PT [place] Acute Rehab Dept. Number: [place]

## 2017-11-13 NOTE — Progress Notes (Signed)
ANTICOAGULATION CONSULT NOTE - Follow Up Consult  Pharmacy Consult for Heparin Indication: chest pain/ACS  No Known Allergies  Patient Measurements: Height: 5\' 2"  (157.5 cm) Weight: 200 lb (90.7 kg) IBW/kg (Calculated) : 50.1 Heparin Dosing Weight: 68.8kg   Vital Signs: Temp: 97.7 F (36.5 C) (06/22 0515) Temp Source: Oral (06/22 0515) BP: 129/62 (06/22 0515) Pulse Rate: 71 (06/22 0515)  Labs: Recent Labs    11/12/17 0900 11/12/17 0913 11/12/17 2035 11/13/17 0722  HGB 13.1 14.3  --  12.6  HCT 42.7 42.0  --  41.1  PLT 207  --   --  204  APTT 27  --   --   --   LABPROT 13.5  --   --   --   INR 1.04  --   --   --   HEPARINUNFRC  --   --  1.02* 1.22*  CREATININE 1.12* 1.00  --   --     Estimated Creatinine Clearance: 57.1 mL/min (by C-G formula based on SCr of 1 mg/dL).   Assessment:  Anticoag: Heparin for r/o ACS (abnormal EKG + syncope) - CBC WNL, no AC PTA - Patient with decreased consciousness overnight, stroke r/o negative. Heparin level this am still elevated at 1.22. Spoke with nurse and noted no concerns with bleeding at this time or concerns with infusion, confirms infusion running at 8 units/ hr.   Goal of Therapy:  Heparin level 0.3-0.7 units/ml Monitor platelets by anticoagulation protocol: Yes   Plan:  Hold heparin infusion x 1 hour (starting around 930am) Decrease heparin drip to 550 units/hr after holding Nurse confirmed patient weight with new bed weight Check HL in 6 hours  Daily HL/ CBC Monitor for S/sx of bleeding   Jalene Mullet, Pharm.D. PGY1 Pharmacy Resident 11/13/2017 9:08 AM Please check AMION for all [place] numbers

## 2017-11-13 NOTE — Progress Notes (Signed)
ANTICOAGULATION CONSULT NOTE - Follow Up Consult  Pharmacy Consult for Heparin Indication: chest pain/ACS  No Known Allergies  Patient Measurements: Height: 5\' 2"  (157.5 cm) Weight: 183 lb 3.2 oz (83.1 kg) IBW/kg (Calculated) : 50.1 Heparin Dosing Weight: 68.8kg   Vital Signs: Temp: 98.1 F (36.7 C) (06/22 1545) Temp Source: Oral (06/22 1545) BP: 107/52 (06/22 1545) Pulse Rate: 75 (06/22 1545)  Labs: Recent Labs    11/12/17 0900 11/12/17 0913 11/12/17 2035 11/13/17 0722 11/13/17 1546  HGB 13.1 14.3  --  12.6  --   HCT 42.7 42.0  --  41.1  --   PLT 207  --   --  204  --   APTT 27  --   --   --   --   LABPROT 13.5  --   --   --   --   INR 1.04  --   --   --   --   HEPARINUNFRC  --   --  1.02* 1.22* 0.55  CREATININE 1.12* 1.00  --   --   --     Estimated Creatinine Clearance: 54.6 mL/min (by C-G formula based on SCr of 1 mg/dL).   Assessment:  Anticoag: Heparin for r/o ACS (abnormal EKG + syncope) - CBC WNL, no AC PTA - Patient with decreased consciousness overnight, stroke r/o negative.  PM heparin level now therapeutic   Goal of Therapy:  Heparin level 0.3-0.7 units/ml Monitor platelets by anticoagulation protocol: Yes   Plan:  Continue heparin at 550 units / hr Daily HL/ CBC Monitor for S/sx of bleeding   Thank you Anette Guarneri, PharmD [phone] 11/13/2017 4:39 PM Please check AMION for all [place] numbers

## 2017-11-13 NOTE — Progress Notes (Signed)
Called by NT to room because pt called out that she needed to use the restroom. When tech entered room, pt able to verbalize clearly to tech that she needed to get up to use the restroom. When RN entered room five minutes later, pt not responding to questions or fully opening eyes and barely articulating name. Pt also barely responding to commands. Vital signs still WNL. Will continue to monitor and assess.

## 2017-11-13 NOTE — Progress Notes (Signed)
Normal ECG. No arrhythmia on telemetry. No additional inpatient cardiology testing is planned. Sanda Klein, MD, [facility] [phone] office [phone] pager

## 2017-11-13 NOTE — Care Management Obs Status (Signed)
[place] NOTIFICATION   Patient Details  Name: Deborah Reid MRN: [id number] Date of Birth: [birth year]   Medicare Observation Status Notification Given:  Yes    Bethena Roys, RN 11/13/2017, 4:28 PM

## 2017-11-14 DIAGNOSIS — R55 Syncope and collapse: Secondary | ICD-10-CM | POA: Diagnosis not present

## 2017-11-14 DIAGNOSIS — F329 Major depressive disorder, single episode, unspecified: Secondary | ICD-10-CM | POA: Diagnosis not present

## 2017-11-14 DIAGNOSIS — K219 Gastro-esophageal reflux disease without esophagitis: Secondary | ICD-10-CM | POA: Diagnosis not present

## 2017-11-14 MED ORDER — ESCITALOPRAM OXALATE 10 MG PO TABS
10.0000 mg | ORAL_TABLET | Freq: Every day | ORAL | Status: DC
  Administered 2017-11-15: 10 mg via ORAL
  Filled 2017-11-14: qty 1

## 2017-11-14 MED ORDER — HEPARIN SODIUM (PORCINE) 5000 UNIT/ML IJ SOLN
5000.0000 [IU] | Freq: Three times a day (TID) | INTRAMUSCULAR | Status: DC
  Administered 2017-11-14 – 2017-11-15 (×3): 5000 [IU] via SUBCUTANEOUS
  Filled 2017-11-14 (×4): qty 1

## 2017-11-14 NOTE — Progress Notes (Signed)
PROGRESS NOTE    Deborah Reid  VZC:[id number] DOB: 16-Jul-1950 DOA: 11/12/2017 PCP: Sherryl Barters, NP    Brief Narrative:   Deborah Reid is a 67 y.o. female with a hx of multiple sclerosis, severe depression, hepatitis C, asthma, glaucoma, RA, normal coronaries in 02/2003 who is being seen today for the evaluation of possible syncope/abnormal EKG.   Assessment & Plan:   Principal Problem:   Syncope Active Problems:   Hepatitis C   Rheumatoid arthritis (HCC)   GERD (gastroesophageal reflux disease)   Depressive disorder   Syncope: Differential include arrhythmias vs neurogenic syncope vs vasovagal vs orthostatic hypotension.  Stroke ruled out with CT angio of the head and neck, MRI brain.  Echo : Systolic function was vigorous. The estimated ejection fraction was in the range of 65%   to 70%. Wall motion was normal; there were no regional wall motion abnormalities. Doppler parameters are consistent with abnormal left ventricular relaxation (grade 1 diastolic  dysfunction). troponin's negative.  EKG on admission slightly abnormal. Repeat EKG wnl.  No arrhythmias on telemetry.  Cardiology consulted and recommended no further cardiac work up.  Suspect due to her symptoms its probably vaso vagal syncope vs orthostatics.  EEG is negative for seizures.  Check orthostatic vital signs in am.  No new work up. Continue with aspirin.     Depression:  Pt was on psychiatry meds at home, which she is not taking at home. Restart the lexapro.   GERD on protonix and stable.    AKI: Probably from dehydration.  Improved with fluids.    DVT prophylaxis: heparin.  Code Status: full code.  Family Communication: none at bedside.  Disposition Plan: discharge to SNF.    Consultants:   Cardiology    Procedures: echocardiogram.  EEG Antimicrobials:none.   Subjective: No chest pain or sob. No nausea, vomiting or abd pain.  Some dizziness this am.   Objective: Vitals:   11/13/17 0900 11/13/17 1545 11/13/17 2055 11/14/17 0525  BP:  (!) 107/52 (!) 115/55 (!) 153/65  Pulse:  75 84 79  Resp:  20 16 20   Temp:  98.1 F (36.7 C) (!) 97.3 F (36.3 C) 98.4 F (36.9 C)  TempSrc:  Oral  Axillary  SpO2:  98% 99% 100%  Weight: 83.1 kg (183 lb 3.2 oz)     Height: 5\' 2"  (1.575 m)       Intake/Output Summary (Last 24 hours) at 11/14/2017 1036 Last data filed at 11/14/2017 0526 Gross per 24 hour  Intake -  Output 601 ml  Net -601 ml   Filed Weights   11/12/17 0858 11/13/17 0900  Weight: 90.7 kg (200 lb) 83.1 kg (183 lb 3.2 oz)    Examination:  General exam: Appears calm and comfortable , not in distress.  Respiratory system: Clear to auscultation. Respiratory effort normal. No wheezing or rhonchi.  Cardiovascular system: S1 & S2 heard, RRR. No JVD, murmurs,  No pedal edema. Gastrointestinal system: Abdomen is soft NT nd bs+ Central nervous system: Alert and oriented. Non focal.  Extremities: Symmetric 5 x 5 power. Skin: No rashes, lesions or ulcers Psychiatry:  Mood & affect appropriate.     Data Reviewed: I have personally reviewed following labs and imaging studies  CBC: Recent Labs  Lab 11/12/17 0900 11/12/17 0913 11/13/17 0722  WBC 5.6  --  5.5  NEUTROABS 2.4  --   --   HGB 13.1 14.3 12.6  HCT 42.7 42.0 41.1  MCV 88.2  --  88.6  PLT 207  --  220   Basic Metabolic Panel: Recent Labs  Lab 11/12/17 0900 11/12/17 0913  NA 141 141  K 4.0 4.0  CL 108 106  CO2 25  --   GLUCOSE 101* 98  BUN 13 16  CREATININE 1.12* 1.00  CALCIUM 9.7  --    GFR: Estimated Creatinine Clearance: 54.6 mL/min (by C-G formula based on SCr of 1 mg/dL). Liver Function Tests: Recent Labs  Lab 11/12/17 0900  AST 29  ALT 24  ALKPHOS 78  BILITOT 0.8  PROT 7.6  ALBUMIN 3.8   No results for input(s): LIPASE, AMYLASE in the last 168 hours. No results for input(s): AMMONIA in the last 168 hours. Coagulation Profile: Recent Labs  Lab 11/12/17 0900  INR  1.04   Cardiac Enzymes: No results for input(s): CKTOTAL, CKMB, CKMBINDEX, TROPONINI in the last 168 hours. BNP (last 3 results) No results for input(s): PROBNP in the last 8760 hours. HbA1C: No results for input(s): HGBA1C in the last 72 hours. CBG: Recent Labs  Lab 11/13/17 0131  GLUCAP 101*   Lipid Profile: No results for input(s): CHOL, HDL, LDLCALC, TRIG, CHOLHDL, LDLDIRECT in the last 72 hours. Thyroid Function Tests: No results for input(s): TSH, T4TOTAL, FREET4, T3FREE, THYROIDAB in the last 72 hours. Anemia Panel: No results for input(s): VITAMINB12, FOLATE, FERRITIN, TIBC, IRON, RETICCTPCT in the last 72 hours. Sepsis Labs: No results for input(s): PROCALCITON, LATICACIDVEN in the last 168 hours.  No results found for this or any previous visit (from the past 240 hour(s)).       Radiology Studies: Ct Head Wo Contrast  Result Date: 11/13/2017 CLINICAL DATA:  Significant change in level of alertness and responsiveness as compared to yesterday. EXAM: CT HEAD WITHOUT CONTRAST TECHNIQUE: Contiguous axial images were obtained from the base of the skull through the vertex without intravenous contrast. COMPARISON:  MRI brain 11/12/2017.  CT head 11/12/2017. FINDINGS: Brain: Mild diffuse cerebral atrophy. Mild ventricular dilatation consistent with central atrophy. Patchy low-attenuation changes in the deep white matter suggesting small vessel ischemia. MS or other white matter disease could also have this appearance. No evidence of acute infarction, hemorrhage, hydrocephalus, extra-axial collection or mass lesion/mass effect. Vascular: Mild intracranial arterial vascular calcifications are present. Skull: Normal. Negative for fracture or focal lesion. Sinuses/Orbits: No acute finding. Other: None. IMPRESSION: No acute intracranial abnormalities. Chronic atrophy and patchy white matter changes are again demonstrated without change. Consider small vessel ischemic change versus MS  plaques. Electronically Signed   By: Lucienne Capers M.D.   On: 11/13/2017 03:32        Scheduled Meds: . aspirin EC  325 mg Oral Daily  . pantoprazole  40 mg Oral Daily   Continuous Infusions:   LOS: 0 days    Time spent: 35 minutes.     Hosie Poisson, MD [facility] Pager [phone]  If 7PM-7AM, please contact night-coverage [email or web address] Password [facility] 11/14/2017, 10:36 AM

## 2017-11-14 NOTE — Plan of Care (Signed)
Completed.

## 2017-11-15 DIAGNOSIS — M069 Rheumatoid arthritis, unspecified: Secondary | ICD-10-CM | POA: Diagnosis not present

## 2017-11-15 DIAGNOSIS — B1920 Unspecified viral hepatitis C without hepatic coma: Secondary | ICD-10-CM | POA: Diagnosis not present

## 2017-11-15 DIAGNOSIS — K219 Gastro-esophageal reflux disease without esophagitis: Secondary | ICD-10-CM | POA: Diagnosis not present

## 2017-11-15 DIAGNOSIS — F329 Major depressive disorder, single episode, unspecified: Secondary | ICD-10-CM | POA: Diagnosis not present

## 2017-11-15 DIAGNOSIS — R55 Syncope and collapse: Secondary | ICD-10-CM | POA: Diagnosis not present

## 2017-11-15 MED ORDER — GABAPENTIN 100 MG PO CAPS: 100.0000 mg | ORAL_CAPSULE | Freq: Two times a day (BID) | ORAL | Status: DC

## 2017-11-15 MED ORDER — ASPIRIN EC 81 MG PO TBEC: 81.0000 mg | DELAYED_RELEASE_TABLET | Freq: Every day | ORAL | 0 refills | Status: DC

## 2017-11-15 MED ORDER — GABAPENTIN 100 MG PO CAPS
100.0000 mg | ORAL_CAPSULE | Freq: Two times a day (BID) | ORAL | Status: DC
  Administered 2017-11-15: 100 mg via ORAL
  Filled 2017-11-15: qty 1

## 2017-11-15 NOTE — Care Management Note (Signed)
Case Management Note  Patient Details  Name: CAMBREY LUPI MRN: [id number] Date of Birth: [birth year]  Subjective/Objective:     Presents with syncope.From home with family.              PCP: Charolette Forward  Action/Plan:  Pt declined SNF placement. Pt will transition to home with home health services to follow. Pt states family will provide 24/7 supervision once d/c.  Pt verbalized interest in [facility] program, NCM made referral with [facility] @ [phone]. Daughter to provide transportation to home.  Expected Discharge Date:    11/15/2016           Expected Discharge Plan:  [place]  In-House Referral:     Discharge planning Services  CM Consult  Post Acute Care Choice:    Choice offered to:  Patient  DME Arranged:  3-N-1, [place] rolling DME Agency:  [place]:  RN, OT, PT, Social Work, Nurse's Aide [place] Agency:  [employer], pending MD's orders. MD aware.  Status of Service:  Completed, signed off  If discussed at [place] of Stay Meetings, dates discussed:    Additional Comments:  Sharin Mons, RN 11/15/2017, 10:33 AM

## 2017-11-15 NOTE — Discharge Summary (Signed)
Physician Discharge Summary  MYRKA SYLVA OHY:[id number] DOB: 1950/12/04 DOA: 11/12/2017  PCP: Sherryl Barters, NP  Admit date: 11/12/2017 Discharge date: 11/15/2017  Admitted From: Home Disposition: Home.   Recommendations for Outpatient Follow-up:  1. Follow up with PCP in 1-2 weeks 2. Please obtain BMP/CBC in one week   Home Health: yes.   Discharge Condition: stable.  CODE STATUS: full code. Diet recommendation: Heart Healthy    Brief/Interim Summary: RODERICA CATHELL a 67 y.o.femalewithfemalewith a hx of multiple sclerosis, severe depression, hepatitis C, asthma, glaucoma, RA, normal coronaries in 10/2004who is being seen today for the evaluation of possible syncope/abnormal EKG.    Discharge Diagnoses:  Principal Problem:   Syncope Active Problems:   Hepatitis C   Rheumatoid arthritis (HCC)   GERD (gastroesophageal reflux disease)   Depressive disorder  Syncope: Differential include arrhythmias vs neurogenic syncope vs vasovagal vs orthostatic hypotension.  Stroke ruled out with CT angio of the head and neck, MRI brain.  Echo : Systolic function wasvigorous. The estimated ejection fraction was in the range of 65% to 70%. Wall motion was normal; there were no regional wall motion abnormalities. Doppler parameters are consistent withabnormal left ventricular relaxation (grade 1 diastolicdysfunction). troponin's negative.  EKG on admission slightly abnormal. Repeat EKG wnl.  No arrhythmias on telemetry.  Cardiology consulted and recommended no further cardiac work up.  Suspect due to her symptoms its probably vaso vagal syncope vs orthostatics.  EEG is negative for seizures. Orthostatic vital sings good.  No new work up. Continue with aspirin.     Depression:  Pt was on psychiatry meds at home, which she is not taking at home. Restart the lexapro.   GERD on protonix and stable.    AKI: Probably from dehydration.  Improved with fluids.       Discharge Instructions  Discharge Instructions    Diet - low sodium heart healthy   Complete by:  As directed    Discharge instructions   Complete by:  As directed    Please follow up with PCP in one week.     Allergies as of 11/15/2017   No Known Allergies     Medication List    STOP taking these medications   methocarbamol 500 MG tablet Commonly known as:  ROBAXIN     TAKE these medications   aspirin EC 81 MG tablet Take 1 tablet (81 mg total) by mouth daily.   cholecalciferol 1000 units tablet Commonly known as:  VITAMIN D Take 1 tablet (1,000 Units total) by mouth daily.   escitalopram 10 MG tablet Commonly known as:  LEXAPRO Take 1 tablet (10 mg total) by mouth at bedtime.   gabapentin 100 MG capsule Commonly known as:  NEURONTIN Take 1 capsule (100 mg total) by mouth 2 (two) times daily.   omeprazole 40 MG capsule Commonly known as:  PRILOSEC Take 40 mg by mouth daily.   traZODone 50 MG tablet Commonly known as:  DESYREL Take 1 tablet (50 mg total) by mouth at bedtime.            Durable Medical Equipment  (From admission, onward)        Start     Ordered   11/15/17 1042  For home use only DME Walker rolling  Once    Question:  Patient needs a walker to treat with the following condition  Answer:  Weakness   11/15/17 1041   11/15/17 1042  For home use only DME 3 n  1  Once     11/15/17 1041     Follow-up Information    [facility].   Specialty:  Home Health Services Why:  home health services arranged Contact information: [place] [address] [place] Follow up.   Why:  rolling walker and 3 in 1/ [facility] will be delivered to beside prior to discharge Contact information: [address] [place] [address] [phone]          No Known Allergies  Consultations:  Cardiology.    Procedures/Studies: Ct Angio Head W  Or Wo Contrast  Result Date: 11/12/2017 CLINICAL DATA:  Syncopal episode, now unresponsive. EXAM: CT ANGIOGRAPHY HEAD AND NECK TECHNIQUE: Multidetector CT imaging of the head and neck was performed using the standard protocol during bolus administration of intravenous contrast. Multiplanar CT image reconstructions and MIPs were obtained to evaluate the vascular anatomy. Carotid stenosis measurements (when applicable) are obtained utilizing NASCET criteria, using the distal internal carotid diameter as the denominator. CONTRAST:  60mL ISOVUE-370 IOPAMIDOL (ISOVUE-370) INJECTION 76% COMPARISON:  Code stroke CT earlier in the day. FINDINGS: CTA NECK FINDINGS Aortic arch: Standard branching. Imaged portion shows no evidence of aneurysm or dissection. No significant stenosis of the major arch vessel origins. Dolichoectasia. Minor atheromatous change. Right carotid system: No evidence of dissection, stenosis (50% or greater) or occlusion. Left carotid system: No evidence of dissection, stenosis (50% or greater) or occlusion. Vertebral arteries: LEFT vertebral slightly dominant, both patent. No evidence dissection, stenosis, or occlusion. No significant ostial narrowing or proximal subclavian disease. Skeleton: Mild spondylosis. No worrisome osseous lesion. No neck masses. Airway patent. Other neck: No masses. Upper chest: None. Review of the MIP images confirms the above findings CTA HEAD FINDINGS Anterior circulation: No significant stenosis, proximal occlusion, aneurysm, or vascular malformation. Posterior circulation: No significant stenosis, proximal occlusion, aneurysm, or vascular malformation. Both vertebrals contribute to formation of the basilar. Venous sinuses: As permitted by contrast timing, patent. Anatomic variants: None of significance. Delayed phase: Not performed. Review of the MIP images confirms the above findings IMPRESSION: No extracranial or intracranial flow-limiting stenosis, dissection, or  occlusion. With regard to the clinical picture of syncopal episode followed by unresponsiveness, the basilar artery specifically, and its major branches, appear widely patent. Electronically Signed   By: Staci Righter M.D.   On: 11/12/2017 09:38   Ct Head Wo Contrast  Result Date: 11/13/2017 CLINICAL DATA:  Significant change in level of alertness and responsiveness as compared to yesterday. EXAM: CT HEAD WITHOUT CONTRAST TECHNIQUE: Contiguous axial images were obtained from the base of the skull through the vertex without intravenous contrast. COMPARISON:  MRI brain 11/12/2017.  CT head 11/12/2017. FINDINGS: Brain: Mild diffuse cerebral atrophy. Mild ventricular dilatation consistent with central atrophy. Patchy low-attenuation changes in the deep white matter suggesting small vessel ischemia. MS or other white matter disease could also have this appearance. No evidence of acute infarction, hemorrhage, hydrocephalus, extra-axial collection or mass lesion/mass effect. Vascular: Mild intracranial arterial vascular calcifications are present. Skull: Normal. Negative for fracture or focal lesion. Sinuses/Orbits: No acute finding. Other: None. IMPRESSION: No acute intracranial abnormalities. Chronic atrophy and patchy white matter changes are again demonstrated without change. Consider small vessel ischemic change versus MS plaques. Electronically Signed   By: Lucienne Capers M.D.   On: 11/13/2017 03:32   Ct Head Wo Contrast  Result Date: 10/23/2017 CLINICAL DATA:  67 year old  female with acute head and neck injury following fall today, with headache and neck pain. EXAM: CT HEAD WITHOUT CONTRAST CT CERVICAL SPINE WITHOUT CONTRAST TECHNIQUE: Multidetector CT imaging of the head and cervical spine was performed following the standard protocol without intravenous contrast. Multiplanar CT image reconstructions of the cervical spine were also generated. COMPARISON:  10/11/2009 MR and CT FINDINGS: CT HEAD FINDINGS  Brain: No evidence of acute infarction, hemorrhage, hydrocephalus, extra-axial collection or mass lesion/mass effect. Mild periventricular white matter hypodensities/chronic small-vessel white matter ischemic changes again noted. Vascular: Atherosclerotic calcifications again noted. Skull: Normal. Negative for fracture or focal lesion. Sinuses/Orbits: No acute finding. Other: None. CT CERVICAL SPINE FINDINGS Alignment: Normal. Skull base and vertebrae: No acute fracture. No primary bone lesion or focal pathologic process. Soft tissues and spinal canal: No prevertebral fluid or swelling. No visible canal hematoma. Disc levels: Mild degenerative disc disease/spondylosis from C4-C7 again noted. Upper chest: No acute abnormality Other: None IMPRESSION: 1. No evidence of acute intracranial abnormality. Chronic white matter hypodensities/small-vessel ischemic changes. 2. No static evidence of acute injury to the cervical spine. Mild multilevel degenerative changes as described. Electronically Signed   By: Margarette Canada M.D.   On: 10/23/2017 21:13   Ct Angio Neck W Or Wo Contrast  Result Date: 11/12/2017 CLINICAL DATA:  Syncopal episode, now unresponsive. EXAM: CT ANGIOGRAPHY HEAD AND NECK TECHNIQUE: Multidetector CT imaging of the head and neck was performed using the standard protocol during bolus administration of intravenous contrast. Multiplanar CT image reconstructions and MIPs were obtained to evaluate the vascular anatomy. Carotid stenosis measurements (when applicable) are obtained utilizing NASCET criteria, using the distal internal carotid diameter as the denominator. CONTRAST:  35mL ISOVUE-370 IOPAMIDOL (ISOVUE-370) INJECTION 76% COMPARISON:  Code stroke CT earlier in the day. FINDINGS: CTA NECK FINDINGS Aortic arch: Standard branching. Imaged portion shows no evidence of aneurysm or dissection. No significant stenosis of the major arch vessel origins. Dolichoectasia. Minor atheromatous change. Right carotid  system: No evidence of dissection, stenosis (50% or greater) or occlusion. Left carotid system: No evidence of dissection, stenosis (50% or greater) or occlusion. Vertebral arteries: LEFT vertebral slightly dominant, both patent. No evidence dissection, stenosis, or occlusion. No significant ostial narrowing or proximal subclavian disease. Skeleton: Mild spondylosis. No worrisome osseous lesion. No neck masses. Airway patent. Other neck: No masses. Upper chest: None. Review of the MIP images confirms the above findings CTA HEAD FINDINGS Anterior circulation: No significant stenosis, proximal occlusion, aneurysm, or vascular malformation. Posterior circulation: No significant stenosis, proximal occlusion, aneurysm, or vascular malformation. Both vertebrals contribute to formation of the basilar. Venous sinuses: As permitted by contrast timing, patent. Anatomic variants: None of significance. Delayed phase: Not performed. Review of the MIP images confirms the above findings IMPRESSION: No extracranial or intracranial flow-limiting stenosis, dissection, or occlusion. With regard to the clinical picture of syncopal episode followed by unresponsiveness, the basilar artery specifically, and its major branches, appear widely patent. Electronically Signed   By: Staci Righter M.D.   On: 11/12/2017 09:38   Ct Cervical Spine Wo Contrast  Result Date: 10/23/2017 CLINICAL DATA:  67 year old female with acute head and neck injury following fall today, with headache and neck pain. EXAM: CT HEAD WITHOUT CONTRAST CT CERVICAL SPINE WITHOUT CONTRAST TECHNIQUE: Multidetector CT imaging of the head and cervical spine was performed following the standard protocol without intravenous contrast. Multiplanar CT image reconstructions of the cervical spine were also generated. COMPARISON:  10/11/2009 MR and CT FINDINGS: CT HEAD FINDINGS Brain: No evidence  of acute infarction, hemorrhage, hydrocephalus, extra-axial collection or mass  lesion/mass effect. Mild periventricular white matter hypodensities/chronic small-vessel white matter ischemic changes again noted. Vascular: Atherosclerotic calcifications again noted. Skull: Normal. Negative for fracture or focal lesion. Sinuses/Orbits: No acute finding. Other: None. CT CERVICAL SPINE FINDINGS Alignment: Normal. Skull base and vertebrae: No acute fracture. No primary bone lesion or focal pathologic process. Soft tissues and spinal canal: No prevertebral fluid or swelling. No visible canal hematoma. Disc levels: Mild degenerative disc disease/spondylosis from C4-C7 again noted. Upper chest: No acute abnormality Other: None IMPRESSION: 1. No evidence of acute intracranial abnormality. Chronic white matter hypodensities/small-vessel ischemic changes. 2. No static evidence of acute injury to the cervical spine. Mild multilevel degenerative changes as described. Electronically Signed   By: Margarette Canada M.D.   On: 10/23/2017 21:13   Mr Brain Wo Contrast  Result Date: 11/12/2017 CLINICAL DATA:  Syncope, fall.  Neurologic deficit. EXAM: MRI HEAD WITHOUT CONTRAST TECHNIQUE: Multiplanar, multiecho pulse sequences of the brain and surrounding structures were obtained without intravenous contrast. COMPARISON:  CTA 11/12/2017 FINDINGS: Limited imaging with diffusion-weighted imaging and axial FLAIR imaging only. Mild atrophy. Negative for hydrocephalus. Chronic microvascular ischemic changes in the white matter. Mild chronic ischemia in the pons Negative for acute infarct.  No midline shift. IMPRESSION: Negative for acute infarct. Atrophy and chronic microvascular ischemic changes in the white matter. Electronically Signed   By: Franchot Gallo M.D.   On: 11/12/2017 10:10   Ct Head Code Stroke Wo Contrast  Result Date: 11/12/2017 CLINICAL DATA:  Code stroke. Altered mental status. Last seen normal 1 hour ago. Witnessed syncopal episode. Previous fall 2 weeks ago. EXAM: CT HEAD WITHOUT CONTRAST TECHNIQUE:  Contiguous axial images were obtained from the base of the skull through the vertex without intravenous contrast. COMPARISON:  10/23/2017. FINDINGS: Brain: No evidence for acute infarction, hemorrhage, mass lesion, hydrocephalus, or extra-axial fluid. Mild atrophy. Hypoattenuation of white matter, likely small vessel disease. Vascular: No hyperdense vessel or unexpected calcification. Skull: Calvarium intact. Sinuses/Orbits: Negative. Other: None. ASPECTS [facility] Stroke Program Early CT Score) - Ganglionic level infarction (caudate, lentiform nuclei, internal capsule, insula, M1-M3 cortex): 7 - Supraganglionic infarction (M4-M6 cortex): 3 Total score (0-10 with 10 being normal): 10 IMPRESSION: 1. Atrophy and small vessel disease. No acute intracranial findings. 2. ASPECTS is 10. These results were communicated to Dr. Lorraine Lax at 9:19 amon 6/21/2019by text page via the [facility] messaging system. Electronically Signed   By: Staci Righter M.D.   On: 11/12/2017 09:21    Echo    Subjective: No new complaints.   Discharge Exam: Vitals:   11/14/17 2150 11/15/17 0531  BP: (!) 143/64 (!) 111/46  Pulse: 72 67  Resp: 16 12  Temp: 98.4 F (36.9 C) 98 F (36.7 C)  SpO2: 98% 90%   Vitals:   11/14/17 0525 11/14/17 1445 11/14/17 2150 11/15/17 0531  BP: (!) 153/65 116/63 (!) 143/64 (!) 111/46  Pulse: 79 80 72 67  Resp: 20 15 16 12   Temp: 98.4 F (36.9 C) 98.7 F (37.1 C) 98.4 F (36.9 C) 98 F (36.7 C)  TempSrc: Axillary Oral Oral Oral  SpO2: 100% 100% 98% 90%  Weight:      Height:        General: Pt is alert, awake, not in acute distress Cardiovascular: RRR, S1/S2 +, no rubs, no gallops Respiratory: CTA bilaterally, no wheezing, no rhonchi Abdominal: Soft, NT, ND, bowel sounds + Extremities: no edema, no cyanosis    The results  of significant diagnostics from this hospitalization (including imaging, microbiology, ancillary and laboratory) are listed below for reference.      Microbiology: No results found for this or any previous visit (from the past 240 hour(s)).   Labs: BNP (last 3 results) No results for input(s): BNP in the last 8760 hours. Basic Metabolic Panel: Recent Labs  Lab 11/12/17 0900 11/12/17 0913  NA 141 141  K 4.0 4.0  CL 108 106  CO2 25  --   GLUCOSE 101* 98  BUN 13 16  CREATININE 1.12* 1.00  CALCIUM 9.7  --    Liver Function Tests: Recent Labs  Lab 11/12/17 0900  AST 29  ALT 24  ALKPHOS 78  BILITOT 0.8  PROT 7.6  ALBUMIN 3.8   No results for input(s): LIPASE, AMYLASE in the last 168 hours. No results for input(s): AMMONIA in the last 168 hours. CBC: Recent Labs  Lab 11/12/17 0900 11/12/17 0913 11/13/17 0722  WBC 5.6  --  5.5  NEUTROABS 2.4  --   --   HGB 13.1 14.3 12.6  HCT 42.7 42.0 41.1  MCV 88.2  --  88.6  PLT 207  --  204   Cardiac Enzymes: No results for input(s): CKTOTAL, CKMB, CKMBINDEX, TROPONINI in the last 168 hours. BNP: Invalid input(s): POCBNP CBG: Recent Labs  Lab 11/13/17 0131  GLUCAP 101*   D-Dimer No results for input(s): DDIMER in the last 72 hours. Hgb A1c No results for input(s): HGBA1C in the last 72 hours. Lipid Profile No results for input(s): CHOL, HDL, LDLCALC, TRIG, CHOLHDL, LDLDIRECT in the last 72 hours. Thyroid function studies No results for input(s): TSH, T4TOTAL, T3FREE, THYROIDAB in the last 72 hours.  Invalid input(s): FREET3 Anemia work up No results for input(s): VITAMINB12, FOLATE, FERRITIN, TIBC, IRON, RETICCTPCT in the last 72 hours. Urinalysis    Component Value Date/Time   COLORURINE STRAW (A) 11/12/2017 1105   APPEARANCEUR CLEAR 11/12/2017 1105   LABSPEC 1.026 11/12/2017 1105   PHURINE 7.0 11/12/2017 1105   GLUCOSEU NEGATIVE 11/12/2017 1105   HGBUR SMALL (A) 11/12/2017 1105   BILIRUBINUR NEGATIVE 11/12/2017 1105   KETONESUR NEGATIVE 11/12/2017 1105   PROTEINUR NEGATIVE 11/12/2017 1105   UROBILINOGEN 1.0 10/04/2010 1455   NITRITE NEGATIVE  11/12/2017 1105   LEUKOCYTESUR TRACE (A) 11/12/2017 1105   Sepsis Labs Invalid input(s): PROCALCITONIN,  WBC,  LACTICIDVEN Microbiology No results found for this or any previous visit (from the past 240 hour(s)).   Time coordinating discharge: 35 minutes  SIGNED:   Hosie Poisson, MD  [facility] 11/15/2017, 11:19 AM Pager   If 7PM-7AM, please contact night-coverage [email or web address] Password [email or web address]

## 2017-11-15 NOTE — Progress Notes (Signed)
Patient received DME.  Discharge teaching given, including activity, diet, follow-up appoints, and medications. Patient and family verbalized understanding of all discharge instructions. IV access was d/c'd. Vitals are stable. Skin is intact except as charted in most recent assessments. Pt to be escorted out by NT, to be driven home by family.

## 2017-11-15 NOTE — Clinical Social Work Note (Signed)
Clinical Social Work Assessment  Patient Details  Name: Deborah Reid MRN: [id number] Date of Birth: 02-03-1951  Date of referral:  11/15/17               Reason for consult:  Facility Placement, Discharge Planning                Permission sought to share information with:    Permission granted to share information::  No  Name::        Agency::     Relationship::     Contact Information:     Housing/Transportation Living arrangements for the past 2 months:  Single Family Home Source of Information:  Patient, Medical Team Patient Interpreter Needed:  None Criminal Activity/Legal Involvement Pertinent to Current Situation/Hospitalization:  No - Comment as needed Significant Relationships:  Adult Children, Other Family Members, Friend Lives with:  Adult Children, Other (Comment)(Grandchildren) Do you feel safe going back to the place where you live?  Yes Need for family participation in patient care:  Yes (Comment)  Care giving concerns:  PT recommending SNF once medically stable for discharge.   Social Worker assessment / plan:  CSW met with patient. RN in room. CSW introduced role and explained that PT recommendations would be discussed. Patient does not want SNF placement. Her weakness is not a concern for returning home. She lives with her daughter and grandchildren, the youngest of which is 43 years old. Patient is agreeable to home health. She is not set up with a home health agency. Will notify RNCM. No further concerns. CSW signing off. Consult again if any other social work needs arise.  Employment status:  Retired Nurse, adult PT Recommendations:  [place] / Referral to community resources:  [place]  Patient/Family's Response to care:  Patient prefers home health. Patient's daughter supportive and involved in patient's care. Patient appreciated social work intervention.  Patient/Family's  Understanding of and Emotional Response to Diagnosis, Current Treatment, and Prognosis:  Patient has a good understanding of the reason for admission and her need for continued therapy after discharge. Patient appears happy with hospital care.  Emotional Assessment Appearance:  Appears stated age Attitude/Demeanor/Rapport:  Engaged, Gracious Affect (typically observed):  Appropriate, Calm, Pleasant Orientation:  Oriented to Self, Oriented to Place, Oriented to  Time, Oriented to Situation Alcohol / Substance use:  Never Used Psych involvement (Current and /or in the community):  No (Comment)  Discharge Needs  Concerns to be addressed:  Care Coordination Readmission within the last 30 days:  No Current discharge risk:  Dependent with Mobility Barriers to Discharge:  Continued Medical Work up   Candie Chroman, LCSW 11/15/2017, 8:56 AM

## 2017-11-15 NOTE — Consult Note (Signed)
[facility] CM Primary Care Navigator  11/15/2017  Deborah Reid 08/19/1950 [id number]   Met with patient and family in theroom to identify possible discharge needs. Patient reports having "dizziness, nausea and passing out- unresponsive" which resulted to this admission.   Patient reports that herprimary care provider isDr. Nolene Ebbs with [facility] which is not under [facility] network.  Patient encouraged to follow-up with her primary care providerafter hospital discharge or when she returns back home in order to help manage her health issues.   For additional questions please contact:  Edwena Felty A. Ashni Lonzo, BSN, RN-BC [facility] PRIMARY CARE Navigator Cell: [phone]

## 2017-11-17 DIAGNOSIS — H409 Unspecified glaucoma: Secondary | ICD-10-CM | POA: Diagnosis not present

## 2017-11-17 DIAGNOSIS — G35 Multiple sclerosis: Secondary | ICD-10-CM | POA: Diagnosis not present

## 2017-11-17 DIAGNOSIS — J45909 Unspecified asthma, uncomplicated: Secondary | ICD-10-CM | POA: Diagnosis not present

## 2017-11-17 DIAGNOSIS — M069 Rheumatoid arthritis, unspecified: Secondary | ICD-10-CM | POA: Diagnosis not present

## 2017-11-17 DIAGNOSIS — F329 Major depressive disorder, single episode, unspecified: Secondary | ICD-10-CM | POA: Diagnosis not present

## 2017-11-17 DIAGNOSIS — Z9181 History of falling: Secondary | ICD-10-CM | POA: Diagnosis not present

## 2017-11-17 DIAGNOSIS — B1920 Unspecified viral hepatitis C without hepatic coma: Secondary | ICD-10-CM | POA: Diagnosis not present

## 2017-11-17 DIAGNOSIS — Z87891 Personal history of nicotine dependence: Secondary | ICD-10-CM | POA: Diagnosis not present

## 2017-11-17 DIAGNOSIS — R55 Syncope and collapse: Secondary | ICD-10-CM | POA: Diagnosis not present

## 2017-11-18 DIAGNOSIS — F329 Major depressive disorder, single episode, unspecified: Secondary | ICD-10-CM | POA: Diagnosis not present

## 2017-11-18 DIAGNOSIS — M069 Rheumatoid arthritis, unspecified: Secondary | ICD-10-CM | POA: Diagnosis not present

## 2017-11-18 DIAGNOSIS — R55 Syncope and collapse: Secondary | ICD-10-CM | POA: Diagnosis not present

## 2017-11-18 DIAGNOSIS — G35 Multiple sclerosis: Secondary | ICD-10-CM | POA: Diagnosis not present

## 2017-11-18 DIAGNOSIS — B1920 Unspecified viral hepatitis C without hepatic coma: Secondary | ICD-10-CM | POA: Diagnosis not present

## 2017-11-18 DIAGNOSIS — H409 Unspecified glaucoma: Secondary | ICD-10-CM | POA: Diagnosis not present

## 2017-11-18 DIAGNOSIS — Z87891 Personal history of nicotine dependence: Secondary | ICD-10-CM | POA: Diagnosis not present

## 2017-11-18 DIAGNOSIS — Z9181 History of falling: Secondary | ICD-10-CM | POA: Diagnosis not present

## 2017-11-18 DIAGNOSIS — J45909 Unspecified asthma, uncomplicated: Secondary | ICD-10-CM | POA: Diagnosis not present

## 2017-11-19 DIAGNOSIS — M069 Rheumatoid arthritis, unspecified: Secondary | ICD-10-CM | POA: Diagnosis not present

## 2017-11-19 DIAGNOSIS — J45909 Unspecified asthma, uncomplicated: Secondary | ICD-10-CM | POA: Diagnosis not present

## 2017-11-19 DIAGNOSIS — G35 Multiple sclerosis: Secondary | ICD-10-CM | POA: Diagnosis not present

## 2017-11-19 DIAGNOSIS — R55 Syncope and collapse: Secondary | ICD-10-CM | POA: Diagnosis not present

## 2017-11-19 DIAGNOSIS — Z9181 History of falling: Secondary | ICD-10-CM | POA: Diagnosis not present

## 2017-11-19 DIAGNOSIS — Z87891 Personal history of nicotine dependence: Secondary | ICD-10-CM | POA: Diagnosis not present

## 2017-11-19 DIAGNOSIS — B1920 Unspecified viral hepatitis C without hepatic coma: Secondary | ICD-10-CM | POA: Diagnosis not present

## 2017-11-19 DIAGNOSIS — F329 Major depressive disorder, single episode, unspecified: Secondary | ICD-10-CM | POA: Diagnosis not present

## 2017-11-19 DIAGNOSIS — H409 Unspecified glaucoma: Secondary | ICD-10-CM | POA: Diagnosis not present

## 2017-11-23 DIAGNOSIS — M069 Rheumatoid arthritis, unspecified: Secondary | ICD-10-CM | POA: Diagnosis not present

## 2017-11-23 DIAGNOSIS — Z87891 Personal history of nicotine dependence: Secondary | ICD-10-CM | POA: Diagnosis not present

## 2017-11-23 DIAGNOSIS — Z9181 History of falling: Secondary | ICD-10-CM | POA: Diagnosis not present

## 2017-11-23 DIAGNOSIS — J45909 Unspecified asthma, uncomplicated: Secondary | ICD-10-CM | POA: Diagnosis not present

## 2017-11-23 DIAGNOSIS — B1920 Unspecified viral hepatitis C without hepatic coma: Secondary | ICD-10-CM | POA: Diagnosis not present

## 2017-11-23 DIAGNOSIS — G35 Multiple sclerosis: Secondary | ICD-10-CM | POA: Diagnosis not present

## 2017-11-23 DIAGNOSIS — F329 Major depressive disorder, single episode, unspecified: Secondary | ICD-10-CM | POA: Diagnosis not present

## 2017-11-23 DIAGNOSIS — H409 Unspecified glaucoma: Secondary | ICD-10-CM | POA: Diagnosis not present

## 2017-11-23 DIAGNOSIS — R55 Syncope and collapse: Secondary | ICD-10-CM | POA: Diagnosis not present

## 2017-11-25 DIAGNOSIS — R55 Syncope and collapse: Secondary | ICD-10-CM | POA: Diagnosis not present

## 2017-11-25 DIAGNOSIS — B1920 Unspecified viral hepatitis C without hepatic coma: Secondary | ICD-10-CM | POA: Diagnosis not present

## 2017-11-25 DIAGNOSIS — Z9181 History of falling: Secondary | ICD-10-CM | POA: Diagnosis not present

## 2017-11-25 DIAGNOSIS — G35 Multiple sclerosis: Secondary | ICD-10-CM | POA: Diagnosis not present

## 2017-11-25 DIAGNOSIS — H409 Unspecified glaucoma: Secondary | ICD-10-CM | POA: Diagnosis not present

## 2017-11-25 DIAGNOSIS — Z87891 Personal history of nicotine dependence: Secondary | ICD-10-CM | POA: Diagnosis not present

## 2017-11-25 DIAGNOSIS — M069 Rheumatoid arthritis, unspecified: Secondary | ICD-10-CM | POA: Diagnosis not present

## 2017-11-25 DIAGNOSIS — J45909 Unspecified asthma, uncomplicated: Secondary | ICD-10-CM | POA: Diagnosis not present

## 2017-11-25 DIAGNOSIS — F329 Major depressive disorder, single episode, unspecified: Secondary | ICD-10-CM | POA: Diagnosis not present

## 2017-11-26 DIAGNOSIS — B1920 Unspecified viral hepatitis C without hepatic coma: Secondary | ICD-10-CM | POA: Diagnosis not present

## 2017-11-26 DIAGNOSIS — F329 Major depressive disorder, single episode, unspecified: Secondary | ICD-10-CM | POA: Diagnosis not present

## 2017-11-26 DIAGNOSIS — H409 Unspecified glaucoma: Secondary | ICD-10-CM | POA: Diagnosis not present

## 2017-11-26 DIAGNOSIS — G35 Multiple sclerosis: Secondary | ICD-10-CM | POA: Diagnosis not present

## 2017-11-26 DIAGNOSIS — M069 Rheumatoid arthritis, unspecified: Secondary | ICD-10-CM | POA: Diagnosis not present

## 2017-11-26 DIAGNOSIS — R55 Syncope and collapse: Secondary | ICD-10-CM | POA: Diagnosis not present

## 2017-11-26 DIAGNOSIS — Z87891 Personal history of nicotine dependence: Secondary | ICD-10-CM | POA: Diagnosis not present

## 2017-11-26 DIAGNOSIS — J45909 Unspecified asthma, uncomplicated: Secondary | ICD-10-CM | POA: Diagnosis not present

## 2017-11-26 DIAGNOSIS — Z9181 History of falling: Secondary | ICD-10-CM | POA: Diagnosis not present

## 2017-11-30 DIAGNOSIS — Z87891 Personal history of nicotine dependence: Secondary | ICD-10-CM | POA: Diagnosis not present

## 2017-11-30 DIAGNOSIS — H409 Unspecified glaucoma: Secondary | ICD-10-CM | POA: Diagnosis not present

## 2017-11-30 DIAGNOSIS — B1920 Unspecified viral hepatitis C without hepatic coma: Secondary | ICD-10-CM | POA: Diagnosis not present

## 2017-11-30 DIAGNOSIS — R55 Syncope and collapse: Secondary | ICD-10-CM | POA: Diagnosis not present

## 2017-11-30 DIAGNOSIS — M069 Rheumatoid arthritis, unspecified: Secondary | ICD-10-CM | POA: Diagnosis not present

## 2017-11-30 DIAGNOSIS — G35 Multiple sclerosis: Secondary | ICD-10-CM | POA: Diagnosis not present

## 2017-11-30 DIAGNOSIS — Z9181 History of falling: Secondary | ICD-10-CM | POA: Diagnosis not present

## 2017-11-30 DIAGNOSIS — F329 Major depressive disorder, single episode, unspecified: Secondary | ICD-10-CM | POA: Diagnosis not present

## 2017-11-30 DIAGNOSIS — J45909 Unspecified asthma, uncomplicated: Secondary | ICD-10-CM | POA: Diagnosis not present

## 2017-12-01 DIAGNOSIS — F329 Major depressive disorder, single episode, unspecified: Secondary | ICD-10-CM | POA: Diagnosis not present

## 2017-12-01 DIAGNOSIS — G35 Multiple sclerosis: Secondary | ICD-10-CM | POA: Diagnosis not present

## 2017-12-01 DIAGNOSIS — H409 Unspecified glaucoma: Secondary | ICD-10-CM | POA: Diagnosis not present

## 2017-12-01 DIAGNOSIS — Z9181 History of falling: Secondary | ICD-10-CM | POA: Diagnosis not present

## 2017-12-01 DIAGNOSIS — J45909 Unspecified asthma, uncomplicated: Secondary | ICD-10-CM | POA: Diagnosis not present

## 2017-12-01 DIAGNOSIS — R55 Syncope and collapse: Secondary | ICD-10-CM | POA: Diagnosis not present

## 2017-12-01 DIAGNOSIS — B1920 Unspecified viral hepatitis C without hepatic coma: Secondary | ICD-10-CM | POA: Diagnosis not present

## 2017-12-01 DIAGNOSIS — Z87891 Personal history of nicotine dependence: Secondary | ICD-10-CM | POA: Diagnosis not present

## 2017-12-01 DIAGNOSIS — M069 Rheumatoid arthritis, unspecified: Secondary | ICD-10-CM | POA: Diagnosis not present

## 2017-12-02 DIAGNOSIS — F329 Major depressive disorder, single episode, unspecified: Secondary | ICD-10-CM | POA: Diagnosis not present

## 2017-12-02 DIAGNOSIS — Z87891 Personal history of nicotine dependence: Secondary | ICD-10-CM | POA: Diagnosis not present

## 2017-12-02 DIAGNOSIS — H409 Unspecified glaucoma: Secondary | ICD-10-CM | POA: Diagnosis not present

## 2017-12-02 DIAGNOSIS — J45909 Unspecified asthma, uncomplicated: Secondary | ICD-10-CM | POA: Diagnosis not present

## 2017-12-02 DIAGNOSIS — M069 Rheumatoid arthritis, unspecified: Secondary | ICD-10-CM | POA: Diagnosis not present

## 2017-12-02 DIAGNOSIS — G35 Multiple sclerosis: Secondary | ICD-10-CM | POA: Diagnosis not present

## 2017-12-02 DIAGNOSIS — R55 Syncope and collapse: Secondary | ICD-10-CM | POA: Diagnosis not present

## 2017-12-02 DIAGNOSIS — B1920 Unspecified viral hepatitis C without hepatic coma: Secondary | ICD-10-CM | POA: Diagnosis not present

## 2017-12-02 DIAGNOSIS — Z9181 History of falling: Secondary | ICD-10-CM | POA: Diagnosis not present

## 2017-12-03 DIAGNOSIS — Z9181 History of falling: Secondary | ICD-10-CM | POA: Diagnosis not present

## 2017-12-03 DIAGNOSIS — H409 Unspecified glaucoma: Secondary | ICD-10-CM | POA: Diagnosis not present

## 2017-12-03 DIAGNOSIS — J45909 Unspecified asthma, uncomplicated: Secondary | ICD-10-CM | POA: Diagnosis not present

## 2017-12-03 DIAGNOSIS — R55 Syncope and collapse: Secondary | ICD-10-CM | POA: Diagnosis not present

## 2017-12-03 DIAGNOSIS — F329 Major depressive disorder, single episode, unspecified: Secondary | ICD-10-CM | POA: Diagnosis not present

## 2017-12-03 DIAGNOSIS — G35 Multiple sclerosis: Secondary | ICD-10-CM | POA: Diagnosis not present

## 2017-12-03 DIAGNOSIS — M069 Rheumatoid arthritis, unspecified: Secondary | ICD-10-CM | POA: Diagnosis not present

## 2017-12-03 DIAGNOSIS — B1920 Unspecified viral hepatitis C without hepatic coma: Secondary | ICD-10-CM | POA: Diagnosis not present

## 2017-12-03 DIAGNOSIS — Z87891 Personal history of nicotine dependence: Secondary | ICD-10-CM | POA: Diagnosis not present

## 2017-12-07 DIAGNOSIS — Z87891 Personal history of nicotine dependence: Secondary | ICD-10-CM | POA: Diagnosis not present

## 2017-12-07 DIAGNOSIS — F329 Major depressive disorder, single episode, unspecified: Secondary | ICD-10-CM | POA: Diagnosis not present

## 2017-12-07 DIAGNOSIS — R55 Syncope and collapse: Secondary | ICD-10-CM | POA: Diagnosis not present

## 2017-12-07 DIAGNOSIS — B1920 Unspecified viral hepatitis C without hepatic coma: Secondary | ICD-10-CM | POA: Diagnosis not present

## 2017-12-07 DIAGNOSIS — H409 Unspecified glaucoma: Secondary | ICD-10-CM | POA: Diagnosis not present

## 2017-12-07 DIAGNOSIS — Z9181 History of falling: Secondary | ICD-10-CM | POA: Diagnosis not present

## 2017-12-07 DIAGNOSIS — M069 Rheumatoid arthritis, unspecified: Secondary | ICD-10-CM | POA: Diagnosis not present

## 2017-12-07 DIAGNOSIS — J45909 Unspecified asthma, uncomplicated: Secondary | ICD-10-CM | POA: Diagnosis not present

## 2017-12-07 DIAGNOSIS — G35 Multiple sclerosis: Secondary | ICD-10-CM | POA: Diagnosis not present

## 2017-12-08 DIAGNOSIS — H409 Unspecified glaucoma: Secondary | ICD-10-CM | POA: Diagnosis not present

## 2017-12-08 DIAGNOSIS — F329 Major depressive disorder, single episode, unspecified: Secondary | ICD-10-CM | POA: Diagnosis not present

## 2017-12-08 DIAGNOSIS — M069 Rheumatoid arthritis, unspecified: Secondary | ICD-10-CM | POA: Diagnosis not present

## 2017-12-08 DIAGNOSIS — Z9181 History of falling: Secondary | ICD-10-CM | POA: Diagnosis not present

## 2017-12-08 DIAGNOSIS — Z87891 Personal history of nicotine dependence: Secondary | ICD-10-CM | POA: Diagnosis not present

## 2017-12-08 DIAGNOSIS — G35 Multiple sclerosis: Secondary | ICD-10-CM | POA: Diagnosis not present

## 2017-12-08 DIAGNOSIS — J45909 Unspecified asthma, uncomplicated: Secondary | ICD-10-CM | POA: Diagnosis not present

## 2017-12-08 DIAGNOSIS — B1920 Unspecified viral hepatitis C without hepatic coma: Secondary | ICD-10-CM | POA: Diagnosis not present

## 2017-12-08 DIAGNOSIS — R55 Syncope and collapse: Secondary | ICD-10-CM | POA: Diagnosis not present

## 2017-12-09 DIAGNOSIS — H409 Unspecified glaucoma: Secondary | ICD-10-CM | POA: Diagnosis not present

## 2017-12-09 DIAGNOSIS — G35 Multiple sclerosis: Secondary | ICD-10-CM | POA: Diagnosis not present

## 2017-12-09 DIAGNOSIS — M069 Rheumatoid arthritis, unspecified: Secondary | ICD-10-CM | POA: Diagnosis not present

## 2017-12-09 DIAGNOSIS — Z87891 Personal history of nicotine dependence: Secondary | ICD-10-CM | POA: Diagnosis not present

## 2017-12-09 DIAGNOSIS — R55 Syncope and collapse: Secondary | ICD-10-CM | POA: Diagnosis not present

## 2017-12-09 DIAGNOSIS — F329 Major depressive disorder, single episode, unspecified: Secondary | ICD-10-CM | POA: Diagnosis not present

## 2017-12-09 DIAGNOSIS — Z9181 History of falling: Secondary | ICD-10-CM | POA: Diagnosis not present

## 2017-12-09 DIAGNOSIS — J45909 Unspecified asthma, uncomplicated: Secondary | ICD-10-CM | POA: Diagnosis not present

## 2017-12-09 DIAGNOSIS — B1920 Unspecified viral hepatitis C without hepatic coma: Secondary | ICD-10-CM | POA: Diagnosis not present

## 2017-12-15 DIAGNOSIS — G35 Multiple sclerosis: Secondary | ICD-10-CM | POA: Diagnosis not present

## 2017-12-15 DIAGNOSIS — Z9181 History of falling: Secondary | ICD-10-CM | POA: Diagnosis not present

## 2017-12-15 DIAGNOSIS — J45909 Unspecified asthma, uncomplicated: Secondary | ICD-10-CM | POA: Diagnosis not present

## 2017-12-15 DIAGNOSIS — Z87891 Personal history of nicotine dependence: Secondary | ICD-10-CM | POA: Diagnosis not present

## 2017-12-15 DIAGNOSIS — H409 Unspecified glaucoma: Secondary | ICD-10-CM | POA: Diagnosis not present

## 2017-12-15 DIAGNOSIS — F329 Major depressive disorder, single episode, unspecified: Secondary | ICD-10-CM | POA: Diagnosis not present

## 2017-12-15 DIAGNOSIS — R55 Syncope and collapse: Secondary | ICD-10-CM | POA: Diagnosis not present

## 2017-12-15 DIAGNOSIS — B1920 Unspecified viral hepatitis C without hepatic coma: Secondary | ICD-10-CM | POA: Diagnosis not present

## 2017-12-15 DIAGNOSIS — M069 Rheumatoid arthritis, unspecified: Secondary | ICD-10-CM | POA: Diagnosis not present

## 2017-12-22 DIAGNOSIS — R55 Syncope and collapse: Secondary | ICD-10-CM | POA: Diagnosis not present

## 2017-12-22 DIAGNOSIS — H409 Unspecified glaucoma: Secondary | ICD-10-CM | POA: Diagnosis not present

## 2017-12-22 DIAGNOSIS — Z9181 History of falling: Secondary | ICD-10-CM | POA: Diagnosis not present

## 2017-12-22 DIAGNOSIS — M069 Rheumatoid arthritis, unspecified: Secondary | ICD-10-CM | POA: Diagnosis not present

## 2017-12-22 DIAGNOSIS — B1920 Unspecified viral hepatitis C without hepatic coma: Secondary | ICD-10-CM | POA: Diagnosis not present

## 2017-12-22 DIAGNOSIS — G35 Multiple sclerosis: Secondary | ICD-10-CM | POA: Diagnosis not present

## 2017-12-22 DIAGNOSIS — F329 Major depressive disorder, single episode, unspecified: Secondary | ICD-10-CM | POA: Diagnosis not present

## 2017-12-22 DIAGNOSIS — Z87891 Personal history of nicotine dependence: Secondary | ICD-10-CM | POA: Diagnosis not present

## 2017-12-22 DIAGNOSIS — J45909 Unspecified asthma, uncomplicated: Secondary | ICD-10-CM | POA: Diagnosis not present

## 2017-12-31 DIAGNOSIS — J45909 Unspecified asthma, uncomplicated: Secondary | ICD-10-CM | POA: Diagnosis not present

## 2017-12-31 DIAGNOSIS — B1920 Unspecified viral hepatitis C without hepatic coma: Secondary | ICD-10-CM | POA: Diagnosis not present

## 2017-12-31 DIAGNOSIS — F329 Major depressive disorder, single episode, unspecified: Secondary | ICD-10-CM | POA: Diagnosis not present

## 2017-12-31 DIAGNOSIS — R55 Syncope and collapse: Secondary | ICD-10-CM | POA: Diagnosis not present

## 2017-12-31 DIAGNOSIS — Z87891 Personal history of nicotine dependence: Secondary | ICD-10-CM | POA: Diagnosis not present

## 2017-12-31 DIAGNOSIS — M069 Rheumatoid arthritis, unspecified: Secondary | ICD-10-CM | POA: Diagnosis not present

## 2017-12-31 DIAGNOSIS — H409 Unspecified glaucoma: Secondary | ICD-10-CM | POA: Diagnosis not present

## 2017-12-31 DIAGNOSIS — G35 Multiple sclerosis: Secondary | ICD-10-CM | POA: Diagnosis not present

## 2017-12-31 DIAGNOSIS — Z9181 History of falling: Secondary | ICD-10-CM | POA: Diagnosis not present

## 2018-01-11 DIAGNOSIS — J45909 Unspecified asthma, uncomplicated: Secondary | ICD-10-CM | POA: Diagnosis not present

## 2018-01-11 DIAGNOSIS — G35 Multiple sclerosis: Secondary | ICD-10-CM | POA: Diagnosis not present

## 2018-01-11 DIAGNOSIS — M069 Rheumatoid arthritis, unspecified: Secondary | ICD-10-CM | POA: Diagnosis not present

## 2018-01-11 DIAGNOSIS — H409 Unspecified glaucoma: Secondary | ICD-10-CM | POA: Diagnosis not present

## 2018-01-11 DIAGNOSIS — B1920 Unspecified viral hepatitis C without hepatic coma: Secondary | ICD-10-CM | POA: Diagnosis not present

## 2018-01-11 DIAGNOSIS — Z87891 Personal history of nicotine dependence: Secondary | ICD-10-CM | POA: Diagnosis not present

## 2018-01-11 DIAGNOSIS — F329 Major depressive disorder, single episode, unspecified: Secondary | ICD-10-CM | POA: Diagnosis not present

## 2018-01-11 DIAGNOSIS — R55 Syncope and collapse: Secondary | ICD-10-CM | POA: Diagnosis not present

## 2018-01-11 DIAGNOSIS — Z9181 History of falling: Secondary | ICD-10-CM | POA: Diagnosis not present

## 2018-01-22 ENCOUNTER — Emergency Department (HOSPITAL_COMMUNITY): Payer: Medicare Other

## 2018-01-22 ENCOUNTER — Emergency Department (HOSPITAL_COMMUNITY)
Admission: EM | Admit: 2018-01-22 | Discharge: 2018-01-22 | Disposition: A | Discharge status: Home / Self Care | Payer: Medicare Other | Attending: Emergency Medicine | Admitting: Emergency Medicine

## 2018-01-22 ENCOUNTER — Encounter (HOSPITAL_COMMUNITY): Payer: Self-pay | Admitting: Emergency Medicine

## 2018-01-22 ENCOUNTER — Other Ambulatory Visit: Payer: Self-pay

## 2018-01-22 DIAGNOSIS — J45909 Unspecified asthma, uncomplicated: Secondary | ICD-10-CM | POA: Insufficient documentation

## 2018-01-22 DIAGNOSIS — Z7982 Long term (current) use of aspirin: Secondary | ICD-10-CM | POA: Diagnosis not present

## 2018-01-22 DIAGNOSIS — R0789 Other chest pain: Secondary | ICD-10-CM | POA: Insufficient documentation

## 2018-01-22 DIAGNOSIS — Z87891 Personal history of nicotine dependence: Secondary | ICD-10-CM | POA: Insufficient documentation

## 2018-01-22 DIAGNOSIS — Z79899 Other long term (current) drug therapy: Secondary | ICD-10-CM | POA: Insufficient documentation

## 2018-01-22 LAB — BASIC METABOLIC PANEL
Anion gap: 9 (ref 5–15)
BUN: 16 mg/dL (ref 8–23)
CHLORIDE: 108 mmol/L (ref 98–111)
CO2: 23 mmol/L (ref 22–32)
CREATININE: 1.1 mg/dL — AB (ref 0.44–1.00)
Calcium: 8.9 mg/dL (ref 8.9–10.3)
GFR calc Af Amer: 59 mL/min — ABNORMAL LOW (ref >60)
GFR calc non Af Amer: 51 mL/min — ABNORMAL LOW (ref >60)
GLUCOSE: 109 mg/dL — AB (ref 70–99)
Potassium: 3.8 mmol/L (ref 3.5–5.1)
Sodium: 140 mmol/L (ref 135–145)

## 2018-01-22 LAB — CBC
HCT: 41.2 % (ref 36.0–46.0)
HEMOGLOBIN: 12.7 g/dL (ref 12.0–15.0)
MCH: 27.4 pg (ref 26.0–34.0)
MCHC: 30.8 g/dL (ref 30.0–36.0)
MCV: 89 fL (ref 78.0–100.0)
Platelets: 192 10*3/uL (ref 150–400)
RBC: 4.63 MIL/uL (ref 3.87–5.11)
RDW: 12.9 % (ref 11.5–15.5)
WBC: 5.4 10*3/uL (ref 4.0–10.5)

## 2018-01-22 LAB — I-STAT TROPONIN, ED
Troponin i, poc: 0.02 ng/mL (ref 0.00–0.08)
Troponin i, poc: 0.02 ng/mL (ref 0.00–0.08)

## 2018-01-22 LAB — HEPATIC FUNCTION PANEL
ALBUMIN: 3.5 g/dL (ref 3.5–5.0)
ALK PHOS: 66 U/L (ref 38–126)
ALT: 18 U/L (ref 0–44)
AST: 26 U/L (ref 15–41)
BILIRUBIN TOTAL: 0.7 mg/dL (ref 0.3–1.2)
Bilirubin, Direct: 0.2 mg/dL (ref 0.0–0.2)
Indirect Bilirubin: 0.5 mg/dL (ref 0.3–0.9)
Total Protein: 7.2 g/dL (ref 6.5–8.1)

## 2018-01-22 LAB — D-DIMER, QUANTITATIVE: D-Dimer, Quant: 2.71 ug/mL-FEU — ABNORMAL HIGH (ref 0.00–0.50)

## 2018-01-22 MED ORDER — IOPAMIDOL (ISOVUE-370) INJECTION 76%
INTRAVENOUS | Status: AC
  Administered 2018-01-22: 100 mL via INTRAVENOUS
  Filled 2018-01-22: qty 100

## 2018-01-22 MED ORDER — OXYCODONE-ACETAMINOPHEN 5-325 MG PO TABS
1.0000 | ORAL_TABLET | Freq: Once | ORAL | Status: AC
  Administered 2018-01-22: 1 via ORAL
  Filled 2018-01-22: qty 1

## 2018-01-22 MED ORDER — IOPAMIDOL (ISOVUE-370) INJECTION 76%: 100.0000 mL | Freq: Once | INTRAVENOUS | Status: DC | PRN

## 2018-01-22 NOTE — ED Notes (Signed)
Patient transported to X-ray 

## 2018-01-22 NOTE — ED Triage Notes (Signed)
Per [facility] pt coming from home c/o left sided chest pressure and right sided neck pain onset of 5am today. 324 aspirin and 1 nitro given en route.

## 2018-01-22 NOTE — Discharge Instructions (Addendum)
Follow-up with your family doctor next week for recheck. 

## 2018-01-22 NOTE — ED Notes (Signed)
Lab reports adding on d-dimer to previous draw.

## 2018-01-22 NOTE — ED Provider Notes (Signed)
[place] EMERGENCY DEPARTMENT Provider Note   CSN: [id number] Arrival date & time: 01/22/18  6073     History   Chief Complaint Chief Complaint  Patient presents with  . Chest Pain  . Neck Pain    HPI [name] is a 67 y.o. female.  Patient complains of left-sided chest pain.  It is sharp in nature only lasts for a few minutes  The history is provided by the patient. No language interpreter was used.  Chest Pain   This is a new problem. The current episode started 6 to 12 hours ago. The problem occurs rarely. The problem has been resolved. The pain is associated with movement. The pain is present in the lateral region. The pain is at a severity of 4/10. The pain is moderate. Pertinent negatives include no abdominal pain, no back pain, no cough and no headaches.  Pertinent negatives for past medical history include no seizures.  Neck Pain   Associated symptoms include chest pain. Pertinent negatives include no headaches.    Past Medical History:  Diagnosis Date  . Asthma   . Depression   . GERD (gastroesophageal reflux disease)   . Glaucoma   . Hepatitis C   . Multiple sclerosis ([place])   . Rheumatoid arthritis [facility])     Patient Active Problem List   Diagnosis Date Noted  . Syncope 11/12/2017  . Hepatitis C 11/12/2017  . Rheumatoid arthritis ([place]) 11/12/2017  . GERD (gastroesophageal reflux disease) 11/12/2017  . Depressive disorder 11/12/2017  . Depression 06/21/2013  . Sleep disturbance 06/21/2013  . Numerous moles 06/21/2013  . Screening for skin cancer 06/21/2013  . HEPATITIS C, CHRONIC 07/01/2009  . MULTIPLE SCLEROSIS 07/01/2009  . ESOPHAGEAL REFLUX 07/01/2009  . DYSPHAGIA UNSPECIFIED 07/01/2009    Past Surgical History:  Procedure Laterality Date  . CHOLECYSTECTOMY       OB History   None      Home Medications    Prior to Admission medications   Medication Sig Start Date End Date Taking? Authorizing Provider    aspirin EC 81 MG tablet Take 1 tablet (81 mg total) by mouth daily. Patient taking differently: Take 81 mg by mouth daily as needed for mild pain.  11/15/17  Yes Hosie Poisson, MD  BETA CAROTENE PO Take 1 capsule by mouth daily.   Yes [provider]  cholecalciferol (VITAMIN D) 1000 UNITS tablet Take 1 tablet (1,000 Units total) by mouth daily. 09/18/13  Yes Rey, Raquel M, NP  escitalopram (LEXAPRO) 10 MG tablet Take 1 tablet (10 mg total) by mouth at bedtime. 09/18/13  Yes Rey, Latina Craver, NP  gabapentin (NEURONTIN) 100 MG capsule Take 1 capsule (100 mg total) by mouth 2 (two) times daily. 11/15/17  Yes Hosie Poisson, MD  Multiple Vitamin (MULTIVITAMIN) tablet Take 1 tablet by mouth daily. Centrum.   Yes [provider]  omeprazole (PRILOSEC) 40 MG capsule Take 40 mg by mouth daily.   Yes [provider]  traZODone (DESYREL) 50 MG tablet Take 1 tablet (50 mg total) by mouth at bedtime. Patient taking differently: Take 25 mg by mouth at bedtime as needed for sleep.  09/18/13  Yes Rey, Latina Craver, NP    Family History Family History  Problem Relation Age of Onset  . Arthritis Mother   . Depression Mother   . Alcohol abuse Father   . Arthritis Father   . Transient ischemic attack Sister   . Cancer Brother  prostate cancer  . Arthritis Sister   . Lupus Sister   . Hypertension Sister   . Heart disease Maternal Grandmother   . Hypertension Maternal Grandmother   . Diabetes Maternal Grandfather   . Hypertension Paternal Grandmother     Social History Social History   Tobacco Use  . Smoking status: Former Smoker    Types: Cigarettes    Last attempt to quit: 06/20/1973    Years since quitting: 44.6  . Smokeless tobacco: Never Used  Substance Use Topics  . Alcohol use: No  . Drug use: No     Allergies   Patient has no known allergies.   Review of Systems Review of Systems  Constitutional: Negative for appetite change and fatigue.  HENT: Negative  for congestion, ear discharge and sinus pressure.   Eyes: Negative for discharge.  Respiratory: Negative for cough.   Cardiovascular: Positive for chest pain.  Gastrointestinal: Negative for abdominal pain and diarrhea.  Genitourinary: Negative for frequency and hematuria.  Musculoskeletal: Positive for neck pain. Negative for back pain.  Skin: Negative for rash.  Neurological: Negative for seizures and headaches.  Psychiatric/Behavioral: Negative for hallucinations.     Physical Exam Updated Vital Signs BP (!) 146/78   Pulse 68   Temp 98.3 F (36.8 C) (Oral)   Resp 11   SpO2 100%   Physical Exam  Constitutional: She is oriented to person, place, and time. She appears well-developed.  HENT:  Head: Normocephalic.  Eyes: Conjunctivae and EOM are normal. No scleral icterus.  Neck: Neck supple. No thyromegaly present.  Cardiovascular: Normal rate and regular rhythm. Exam reveals no gallop and no friction rub.  No murmur heard. Pulmonary/Chest: No stridor. She has no wheezes. She has no rales. She exhibits no tenderness.  Abdominal: She exhibits no distension. There is no tenderness. There is no rebound.  Musculoskeletal: Normal range of motion. She exhibits no edema.  Lymphadenopathy:    She has no cervical adenopathy.  Neurological: She is oriented to person, place, and time. She exhibits normal muscle tone. Coordination normal.  Skin: No rash noted. No erythema.  Psychiatric: She has a normal mood and affect. Her behavior is normal.     ED Treatments / Results  Labs (all labs ordered are listed, but only abnormal results are displayed) Labs Reviewed  BASIC METABOLIC PANEL - Abnormal; Notable for the following components:      Result Value   Glucose, Bld 109 (*)    Creatinine, Ser 1.10 (*)    GFR calc non Af Amer 51 (*)    GFR calc Af Amer 59 (*)    All other components within normal limits  D-DIMER, QUANTITATIVE (NOT AT [facility]) - Abnormal; Notable for the following  components:   D-Dimer, Quant 2.71 (*)    All other components within normal limits  CBC  HEPATIC FUNCTION PANEL  I-STAT TROPONIN, ED  I-STAT TROPONIN, ED    EKG EKG Interpretation  Date/Time:  Saturday January 22 2018 08:00:37 EDT Ventricular Rate:  66 PR Interval:    QRS Duration: 74 QT Interval:  408 QTC Calculation: 428 R Axis:   19 Text Interpretation:  Sinus rhythm Confirmed by Milton Ferguson [phone]) on 01/22/2018 11:30:14 AM Also confirmed by Milton Ferguson [phone])  on 01/22/2018 11:30:43 AM   Radiology Dg Chest 2 View  Result Date: 01/22/2018 CLINICAL DATA:  Per [facility] pt coming from home c/o left sided chest pressure and right sided neck pain onset of 5am today. 324 aspirin and  1 nitro given en route. Hx of asthma, hep C, MS. EXAM: CHEST - 2 VIEW COMPARISON:  05/12/2012 FINDINGS: Cardiac silhouette is normal in size and configuration. Normal mediastinal and hilar contours. Clear lungs.  No pleural effusion or pneumothorax. Skeletal structures are intact. IMPRESSION: No active cardiopulmonary disease. Electronically Signed   By: Lajean Manes M.D.   On: 01/22/2018 09:41   Ct Angio Chest Pe W And/or Wo Contrast  Result Date: 01/22/2018 CLINICAL DATA:  Left chest pressure and pain beginning at 5 a.m. this morning. EXAM: CT ANGIOGRAPHY CHEST WITH CONTRAST TECHNIQUE: Multidetector CT imaging of the chest was performed using the standard protocol during bolus administration of intravenous contrast. Multiplanar CT image reconstructions and MIPs were obtained to evaluate the vascular anatomy. CONTRAST:  100 mL ISOVUE-370 IOPAMIDOL (ISOVUE-370) INJECTION 76% COMPARISON:  PA and lateral chest earlier today CT. Chest 10/11/2009. FINDINGS: Cardiovascular: Satisfactory opacification of the pulmonary arteries to the segmental level. No evidence of pulmonary embolism. Normal heart size. No pericardial effusion. Mediastinum/Nodes: No enlarged mediastinal, hilar, or axillary lymph nodes. Thyroid  gland, trachea, and esophagus demonstrate no significant findings. Lungs/Pleura: No pleural effusion. The lungs demonstrate only mild dependent atelectatic scratch the lungs demonstrate only mild dependent atelectatic change. Upper Abdomen: Status post cholecystectomy.  Otherwise negative. Musculoskeletal: No acute or focal abnormality. Review of the MIP images confirms the above findings. IMPRESSION: Negative for pulmonary embolus.  Negative chest CT. Electronically Signed   By: Inge Rise M.D.   On: 01/22/2018 15:20    Procedures Procedures (including critical care time)  Medications Ordered in ED Medications  iopamidol (ISOVUE-370) 76 % injection 100 mL (has no administration in time range)  oxyCODONE-acetaminophen (PERCOCET/ROXICET) 5-325 MG per tablet 1 tablet (1 tablet Oral Given 01/22/18 1206)  iopamidol (ISOVUE-370) 76 % injection (100 mLs Intravenous Contrast Given 01/22/18 1450)     Initial Impression / Assessment and Plan / ED Course  I have reviewed the triage vital signs and the nursing notes.  Pertinent labs & imaging results that were available during my care of the patient were reviewed by me and considered in my medical decision making (see chart for details).     Patient with atypical chest pain.  Troponin x2 is negative CT Angie of the chest negative patient feeling better.  Patient also had a normal EKG.  Doubt coronary disease she will follow-up with her PCP  Final Clinical Impressions(s) / ED Diagnoses   Final diagnoses:  Atypical chest pain    ED Discharge Orders    None       Milton Ferguson, MD 01/22/18 1559

## 2018-01-22 NOTE — ED Notes (Signed)
Patients daughter called and states will come to pick patient up in approx 30 mins. Patient provided a sandwich and escorted to lobby to wait for ride. Lobby staff updated on patient.

## 2019-06-14 IMAGING — DX DG CHEST 2V
2 series · 2 of 2 positions shown · non-contrast
Comparison: 05/12/2012

CLINICAL DATA: Per [REDACTED] pt coming from home c/o left sided chest
pressure and right sided neck pain onset of 5am today. 324 aspirin
and 1 nitro given en route. Hx of asthma, hep C, MS.

EXAM:
CHEST - 2 VIEW

[chest ap]
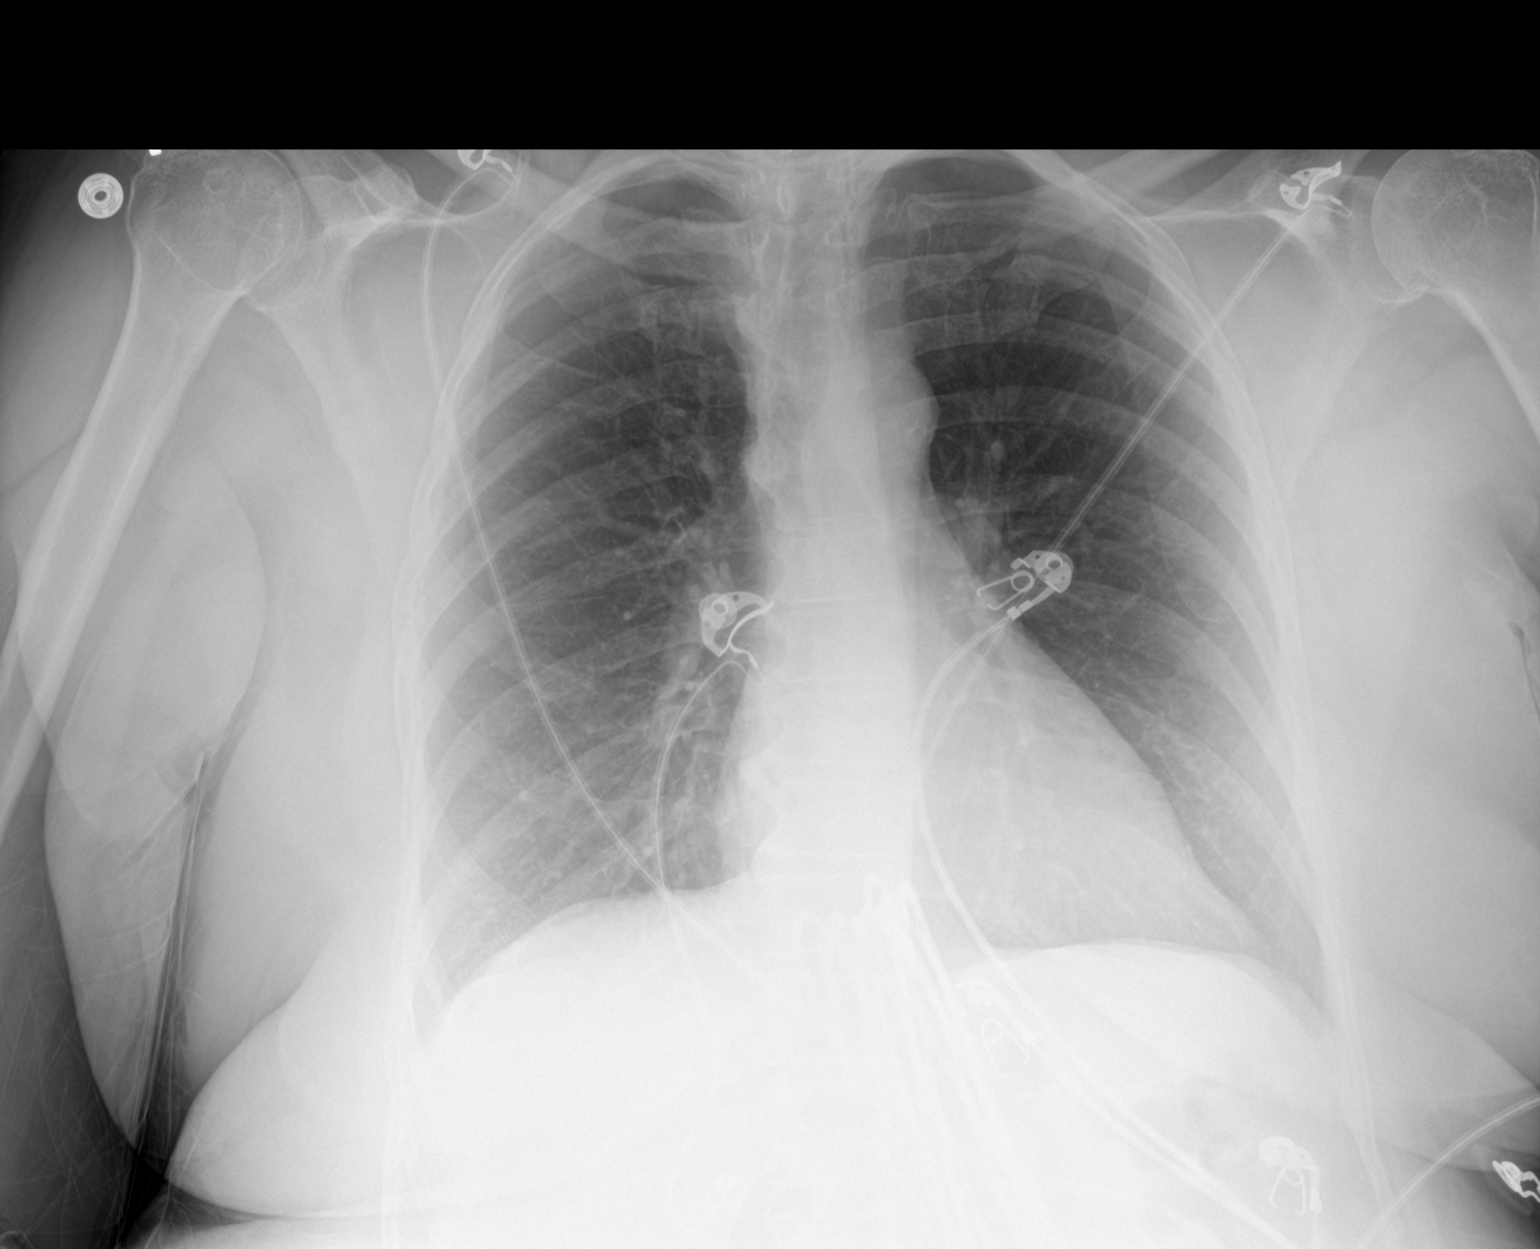

[chest lat]
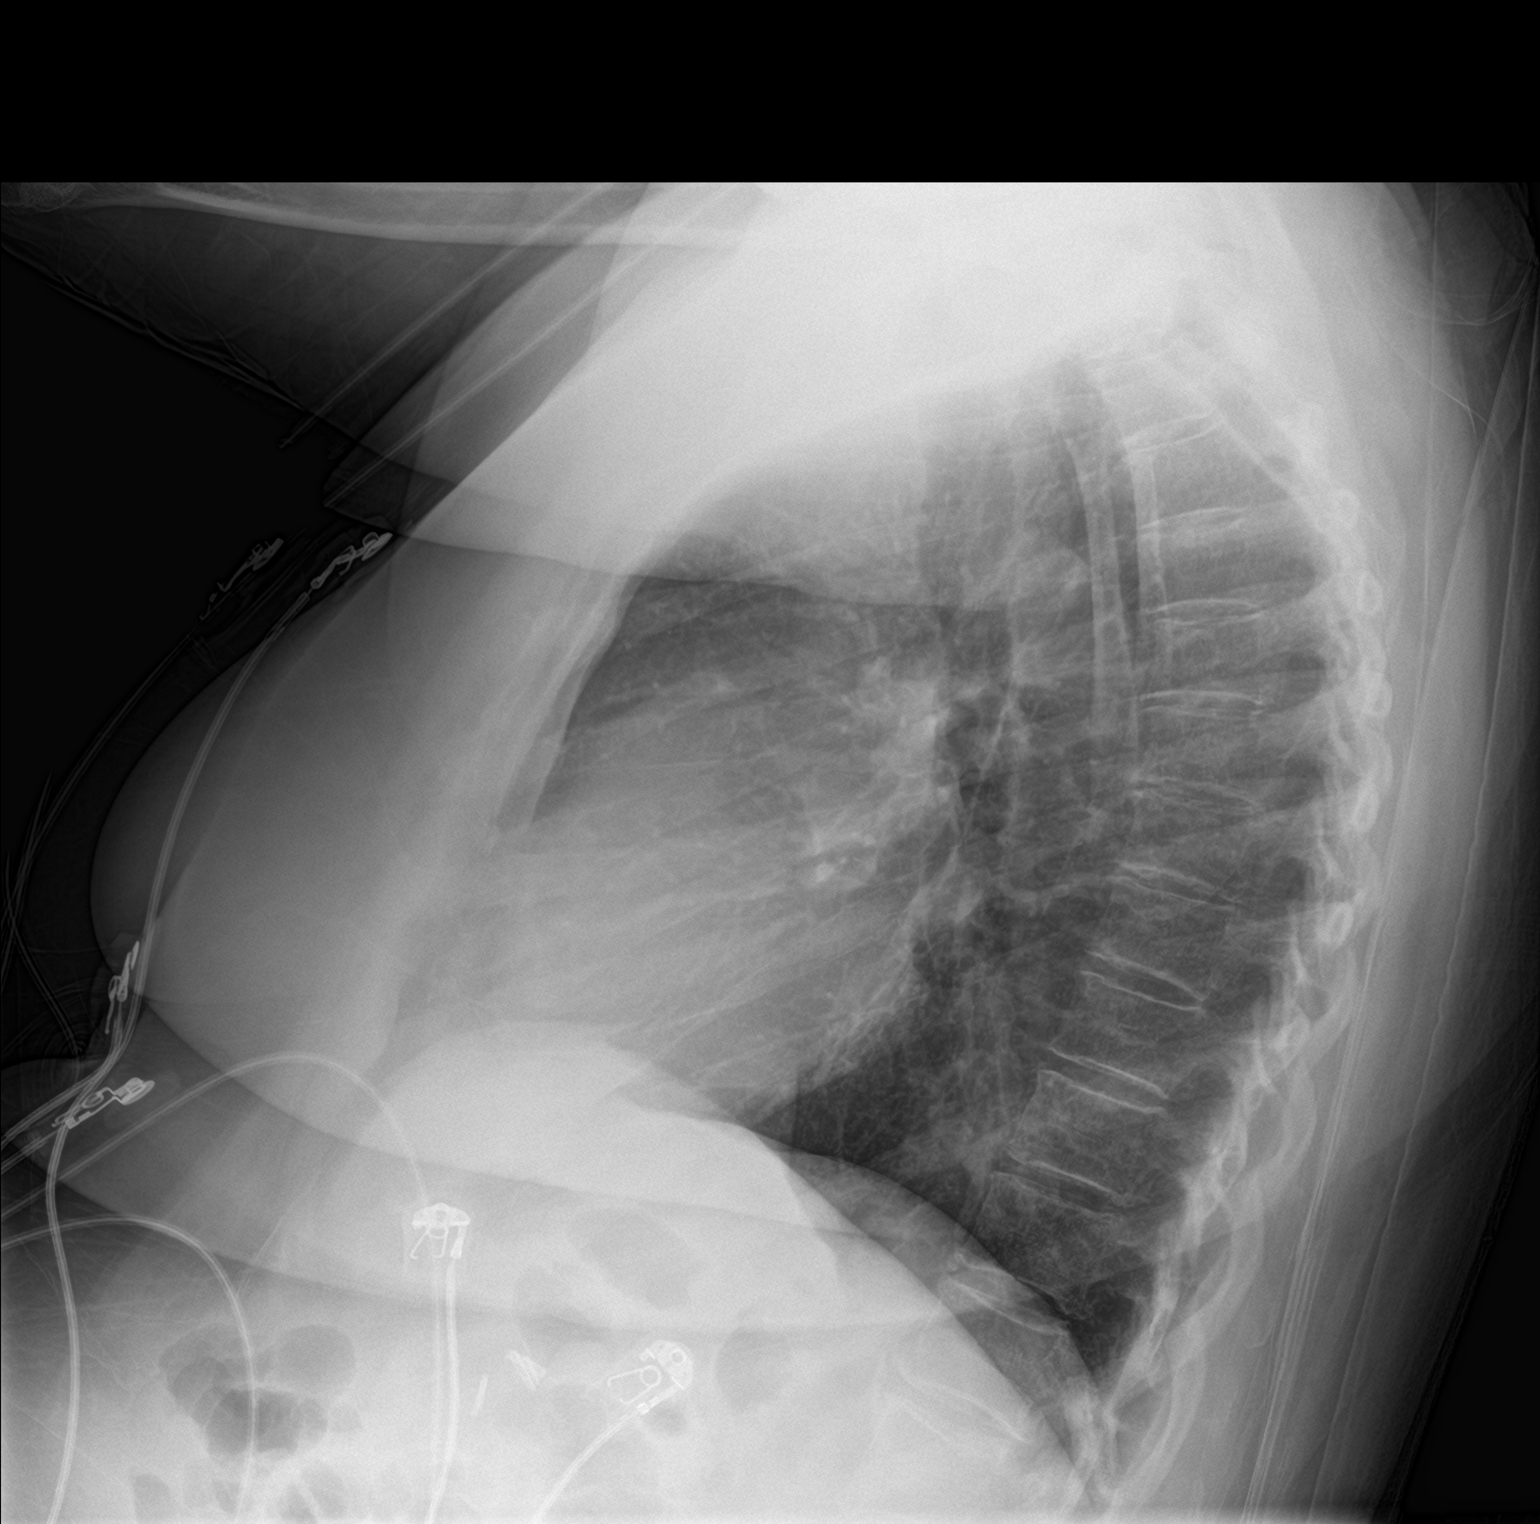

[2 of 2 positions shown; findings below may reference images not displayed]

FINDINGS: Cardiac silhouette is normal in size and configuration. Normal
mediastinal and hilar contours.

Clear lungs.  No pleural effusion or pneumothorax.

Skeletal structures are intact.
IMPRESSION: No active cardiopulmonary disease.

## 2021-02-26 ENCOUNTER — Other Ambulatory Visit: Payer: Self-pay | Admitting: Student

## 2021-02-26 DIAGNOSIS — E2839 Other primary ovarian failure: Secondary | ICD-10-CM

## 2021-03-10 ENCOUNTER — Encounter (HOSPITAL_COMMUNITY): Payer: Self-pay

## 2021-03-10 ENCOUNTER — Emergency Department (HOSPITAL_COMMUNITY): Payer: Medicare HMO

## 2021-03-10 ENCOUNTER — Emergency Department (HOSPITAL_COMMUNITY)
Admission: EM | Admit: 2021-03-10 | Discharge: 2021-03-10 | Disposition: A | Discharge status: Home / Self Care | Payer: Medicare HMO | Attending: Emergency Medicine | Admitting: Emergency Medicine

## 2021-03-10 DIAGNOSIS — R079 Chest pain, unspecified: Secondary | ICD-10-CM | POA: Insufficient documentation

## 2021-03-10 DIAGNOSIS — T7840XA Allergy, unspecified, initial encounter: Secondary | ICD-10-CM | POA: Insufficient documentation

## 2021-03-10 DIAGNOSIS — J45909 Unspecified asthma, uncomplicated: Secondary | ICD-10-CM | POA: Diagnosis not present

## 2021-03-10 DIAGNOSIS — Z87891 Personal history of nicotine dependence: Secondary | ICD-10-CM | POA: Insufficient documentation

## 2021-03-10 DIAGNOSIS — T450X5A Adverse effect of antiallergic and antiemetic drugs, initial encounter: Secondary | ICD-10-CM | POA: Insufficient documentation

## 2021-03-10 DIAGNOSIS — R0602 Shortness of breath: Secondary | ICD-10-CM | POA: Diagnosis not present

## 2021-03-10 DIAGNOSIS — Z7982 Long term (current) use of aspirin: Secondary | ICD-10-CM | POA: Insufficient documentation

## 2021-03-10 DIAGNOSIS — Z79899 Other long term (current) drug therapy: Secondary | ICD-10-CM | POA: Insufficient documentation

## 2021-03-10 DIAGNOSIS — X58XXXA Exposure to other specified factors, initial encounter: Secondary | ICD-10-CM | POA: Diagnosis not present

## 2021-03-10 DIAGNOSIS — I10 Essential (primary) hypertension: Secondary | ICD-10-CM | POA: Insufficient documentation

## 2021-03-10 DIAGNOSIS — T50905A Adverse effect of unspecified drugs, medicaments and biological substances, initial encounter: Secondary | ICD-10-CM

## 2021-03-10 HISTORY — DX: Essential (primary) hypertension: I10

## 2021-03-10 LAB — BASIC METABOLIC PANEL
Anion gap: 7 (ref 5–15)
BUN: 15 mg/dL (ref 8–23)
CO2: 27 mmol/L (ref 22–32)
Calcium: 9.1 mg/dL (ref 8.9–10.3)
Chloride: 106 mmol/L (ref 98–111)
Creatinine, Ser: 0.84 mg/dL (ref 0.44–1.00)
GFR, Estimated: >60 mL/min (ref >60)
Glucose, Bld: 100 mg/dL — ABNORMAL HIGH (ref 70–99)
Potassium: 3.8 mmol/L (ref 3.5–5.1)
Sodium: 140 mmol/L (ref 135–145)

## 2021-03-10 LAB — CBC WITH DIFFERENTIAL/PLATELET
Abs Immature Granulocytes: 0.01 10*3/uL (ref 0.00–0.07)
Basophils Absolute: 0 10*3/uL (ref 0.0–0.1)
Basophils Relative: 1 %
Eosinophils Absolute: 0.2 10*3/uL (ref 0.0–0.5)
Eosinophils Relative: 3 %
HCT: 41.1 % (ref 36.0–46.0)
Hemoglobin: 12.8 g/dL (ref 12.0–15.0)
Immature Granulocytes: 0 %
Lymphocytes Relative: 35 %
Lymphs Abs: 1.8 10*3/uL (ref 0.7–4.0)
MCH: 26.9 pg (ref 26.0–34.0)
MCHC: 31.1 g/dL (ref 30.0–36.0)
MCV: 86.5 fL (ref 80.0–100.0)
Monocytes Absolute: 0.5 10*3/uL (ref 0.1–1.0)
Monocytes Relative: 10 %
Neutro Abs: 2.6 10*3/uL (ref 1.7–7.7)
Neutrophils Relative %: 51 %
Platelets: 206 10*3/uL (ref 150–400)
RBC: 4.75 MIL/uL (ref 3.87–5.11)
RDW: 13.2 % (ref 11.5–15.5)
WBC: 5.2 10*3/uL (ref 4.0–10.5)
nRBC: 0 % (ref 0.0–0.2)

## 2021-03-10 LAB — TROPONIN I (HIGH SENSITIVITY): Troponin I (High Sensitivity): 3 ng/L (ref <18)

## 2021-03-10 NOTE — ED Triage Notes (Signed)
BIBA Per EMS: Pt coming from home w/ c/o an allergic reaction to what pt thinks is her hydroxyzine Tongue swelling that began yesterday and headache  Denies hives; SHOB  97% RA 160/90  80 HR

## 2021-03-10 NOTE — ED Provider Notes (Signed)
[place] DEPT Provider Note   CSN: [id number] Arrival date & time: 03/10/21  1210     History Chief Complaint  Patient presents with   Allergic Reaction    Deborah Reid is a 70 y.o. female.  70 yo F with a chief complaints of feeling hot off-and-on worse on her head and her low back seems to be coming and going.  Started about a week and a half ago.  Has had some diarrhea off and on.  She started amlodipine as well as hydroxyzine.  She thinks maybe she is having allergic reaction to this.  She has had some chest pain and pressure off and on with this 2.  Denies exertional symptoms.  Denies cough congestion or fever.  Denies nausea or vomiting.  Denies abdominal pain.  The history is provided by the patient.  Allergic Reaction Presenting symptoms: no wheezing   Illness Severity:  Moderate Onset quality:  Gradual Duration:  10 days Timing:  Constant Progression:  Worsening Chronicity:  New Associated symptoms: diarrhea and shortness of breath   Associated symptoms: no chest pain, no congestion, no fever, no headaches, no myalgias, no nausea, no rhinorrhea, no vomiting and no wheezing       Past Medical History:  Diagnosis Date   Asthma    Depression    GERD (gastroesophageal reflux disease)    Glaucoma    Hepatitis C    Hypertension    Multiple sclerosis ([place])    Rheumatoid arthritis ([place])     Patient Active Problem List   Diagnosis Date Noted   Syncope 11/12/2017   Hepatitis C 11/12/2017   Rheumatoid arthritis ([place]) 11/12/2017   GERD (gastroesophageal reflux disease) 11/12/2017   Depressive disorder 11/12/2017   Depression 06/21/2013   Sleep disturbance 06/21/2013   Numerous moles 06/21/2013   Screening for skin cancer 06/21/2013   HEPATITIS C, CHRONIC 07/01/2009   MULTIPLE SCLEROSIS 07/01/2009   ESOPHAGEAL REFLUX 07/01/2009   DYSPHAGIA UNSPECIFIED 07/01/2009    Past Surgical History:  Procedure Laterality Date    CHOLECYSTECTOMY       OB History   No obstetric history on file.     Family History  Problem Relation Age of Onset   Arthritis Mother    Depression Mother    Alcohol abuse Father    Arthritis Father    Transient ischemic attack Sister    Cancer Brother        prostate cancer   Arthritis Sister    Lupus Sister    Hypertension Sister    Heart disease Maternal Grandmother    Hypertension Maternal Grandmother    Diabetes Maternal Grandfather    Hypertension Paternal Grandmother     Social History   Tobacco Use   Smoking status: Former    Types: Cigarettes    Quit date: 06/20/1973    Years since quitting: 47.7   Smokeless tobacco: Never  Vaping Use   Vaping Use: Never used  Substance Use Topics   Alcohol use: No   Drug use: No    Home Medications Prior to Admission medications   Medication Sig Start Date End Date Taking? Authorizing Provider  aspirin EC 81 MG tablet Take 1 tablet (81 mg total) by mouth daily. Patient taking differently: Take 81 mg by mouth daily as needed for mild pain.  11/15/17   Hosie Poisson, MD  BETA CAROTENE PO Take 1 capsule by mouth daily.    [provider]  cholecalciferol (VITAMIN D)  1000 UNITS tablet Take 1 tablet (1,000 Units total) by mouth daily. 09/18/13   Rey, Latina Craver, NP  escitalopram (LEXAPRO) 10 MG tablet Take 1 tablet (10 mg total) by mouth at bedtime. 09/18/13   Rey, Latina Craver, NP  gabapentin (NEURONTIN) 100 MG capsule Take 1 capsule (100 mg total) by mouth 2 (two) times daily. 11/15/17   Hosie Poisson, MD  Multiple Vitamin (MULTIVITAMIN) tablet Take 1 tablet by mouth daily. Centrum.    [provider]  omeprazole (PRILOSEC) 40 MG capsule Take 40 mg by mouth daily.    [provider]  traZODone (DESYREL) 50 MG tablet Take 1 tablet (50 mg total) by mouth at bedtime. Patient taking differently: Take 25 mg by mouth at bedtime as needed for sleep.  09/18/13   Sherryl Barters, NP    Allergies    Patient has no  known allergies.  Review of Systems   Review of Systems  Constitutional:  Negative for chills and fever.  HENT:  Negative for congestion and rhinorrhea.   Eyes:  Negative for redness and visual disturbance.  Respiratory:  Positive for shortness of breath. Negative for wheezing.   Cardiovascular:  Negative for chest pain and palpitations.  Gastrointestinal:  Positive for diarrhea. Negative for nausea and vomiting.  Genitourinary:  Negative for dysuria and urgency.  Musculoskeletal:  Negative for arthralgias and myalgias.  Skin:  Negative for pallor and wound.  Neurological:  Negative for dizziness and headaches.   Physical Exam Updated Vital Signs BP (!) 162/80 (BP Location: Right Arm)   Pulse 70   Temp 98.4 F (36.9 C) (Oral)   Resp 17   Ht 5\' 2"  (1.575 m)   Wt 82.1 kg   SpO2 100%   BMI 33.11 kg/m   Physical Exam Vitals and nursing note reviewed.  Constitutional:      General: She is not in acute distress.    Appearance: She is well-developed. She is not diaphoretic.  HENT:     Head: Normocephalic and atraumatic.  Eyes:     Pupils: Pupils are equal, round, and reactive to light.  Cardiovascular:     Rate and Rhythm: Normal rate and regular rhythm.     Heart sounds: No murmur heard.   No friction rub. No gallop.  Pulmonary:     Effort: Pulmonary effort is normal.     Breath sounds: No wheezing or rales.  Abdominal:     General: There is no distension.     Palpations: Abdomen is soft.     Tenderness: There is no abdominal tenderness.  Musculoskeletal:        General: No tenderness.     Cervical back: Normal range of motion and neck supple.  Skin:    General: Skin is warm and dry.  Neurological:     Mental Status: She is alert and oriented to person, place, and time.  Psychiatric:        Behavior: Behavior normal.    ED Results / Procedures / Treatments   Labs (all labs ordered are listed, but only abnormal results are displayed) Labs Reviewed  BASIC  METABOLIC PANEL - Abnormal; Notable for the following components:      Result Value   Glucose, Bld 100 (*)    All other components within normal limits  CBC WITH DIFFERENTIAL/PLATELET  TROPONIN I (HIGH SENSITIVITY)    EKG None  Radiology DG Chest Port 1 View  Result Date: 03/10/2021 CLINICAL DATA:  Chest pain. EXAM: PORTABLE CHEST  1 VIEW COMPARISON:  01/22/2018 FINDINGS: The heart size and mediastinal contours are within normal limits. Both lungs are clear. The visualized skeletal structures are unremarkable. Negative for a pneumothorax. IMPRESSION: No active disease. Electronically Signed   By: Markus Daft M.D.   On: 03/10/2021 13:28    Procedures Procedures   Medications Ordered in ED Medications - No data to display  ED Course  I have reviewed the triage vital signs and the nursing notes.  Pertinent labs & imaging results that were available during my care of the patient were reviewed by me and considered in my medical decision making (see chart for details).    MDM Rules/Calculators/A&P                           70 yo F with a chief complaints of concern for allergic reaction.  Patient states that she has been feeling warm off and on usually on the top of her head but sometimes in her low back and legs.  She has had some diarrhea.  She denies any other obvious infectious symptoms.  She has had some chest pressure off and on but does not seem to be exertional.  Does not sound like a classic allergic reaction based on the patient's symptoms.  Could be a medicine intolerance.  She is having some chest discomfort so obtain a single troponin chest x-ray blood work reassess.  Work-up is unremarkable.  Discharge home.  PCP follow-up.   3:00 PM:  I have discussed the diagnosis/risks/treatment options with the patient and believe the pt to be eligible for discharge home to follow-up with PCP. We also discussed returning to the ED immediately if new or worsening sx occur. We  discussed the sx which are most concerning (e.g., sudden worsening pain, fever, inability to tolerate by mouth) that necessitate immediate return. Medications administered to the patient during their visit and any new prescriptions provided to the patient are listed below.  Medications given during this visit Medications - No data to display   The patient appears reasonably screen and/or stabilized for discharge and I doubt any other medical condition or other [facility] requiring further screening, evaluation, or treatment in the ED at this time prior to discharge.   Final Clinical Impression(s) / ED Diagnoses Final diagnoses:  Adverse effect of drug, initial encounter    Rx / DC Orders ED Discharge Orders     None        Deno Etienne, DO 03/10/21 1500

## 2021-03-10 NOTE — ED Notes (Signed)
Pt states she does not have her walker with her as she came via EMS  Pt also notes she has no money and no way to get home.  Daughter is home but has no transportation.  Safe transport called to take patient home where daughter will assist her into house with her walker.

## 2021-03-10 NOTE — Discharge Instructions (Signed)
There was no abnormal lab value.  Please follow-up with your doctor that prescribed these medicines for you and see if they want to change any of your medicines.

## 2021-03-10 NOTE — ED Triage Notes (Signed)
Pt reports she started having tongue swelling and itching shortley after starting medication around 10/3 but got markedly worse yesterday. States the 'top of my head is burning', endorses itchy eyes. Received benadryl from EMS.

## 2021-03-28 ENCOUNTER — Telehealth: Payer: Self-pay

## 2021-03-28 NOTE — Telephone Encounter (Signed)
NOTES SCANNED TO REFERRAL 

## 2021-04-09 ENCOUNTER — Other Ambulatory Visit: Payer: Self-pay | Admitting: Student

## 2021-04-09 DIAGNOSIS — Z1231 Encounter for screening mammogram for malignant neoplasm of breast: Secondary | ICD-10-CM

## 2021-05-23 ENCOUNTER — Encounter (HOSPITAL_COMMUNITY): Payer: Self-pay | Admitting: Emergency Medicine

## 2021-05-23 ENCOUNTER — Emergency Department (HOSPITAL_COMMUNITY): Payer: Medicare HMO

## 2021-05-23 ENCOUNTER — Emergency Department (HOSPITAL_COMMUNITY)
Admission: EM | Admit: 2021-05-23 | Discharge: 2021-05-24 | Disposition: A | Discharge status: Home / Self Care | Payer: Medicare HMO | Attending: Emergency Medicine | Admitting: Emergency Medicine

## 2021-05-23 DIAGNOSIS — G8929 Other chronic pain: Secondary | ICD-10-CM | POA: Diagnosis not present

## 2021-05-23 DIAGNOSIS — J45909 Unspecified asthma, uncomplicated: Secondary | ICD-10-CM | POA: Diagnosis not present

## 2021-05-23 DIAGNOSIS — R0789 Other chest pain: Secondary | ICD-10-CM | POA: Insufficient documentation

## 2021-05-23 DIAGNOSIS — R079 Chest pain, unspecified: Secondary | ICD-10-CM

## 2021-05-23 DIAGNOSIS — I10 Essential (primary) hypertension: Secondary | ICD-10-CM | POA: Insufficient documentation

## 2021-05-23 DIAGNOSIS — Z87891 Personal history of nicotine dependence: Secondary | ICD-10-CM | POA: Diagnosis not present

## 2021-05-23 DIAGNOSIS — R110 Nausea: Secondary | ICD-10-CM | POA: Diagnosis not present

## 2021-05-23 LAB — BASIC METABOLIC PANEL
Anion gap: 8 (ref 5–15)
BUN: 19 mg/dL (ref 8–23)
CO2: 25 mmol/L (ref 22–32)
Calcium: 9.7 mg/dL (ref 8.9–10.3)
Chloride: 105 mmol/L (ref 98–111)
Creatinine, Ser: 0.92 mg/dL (ref 0.44–1.00)
GFR, Estimated: >60 mL/min (ref >60)
Glucose, Bld: 86 mg/dL (ref 70–99)
Potassium: 4.1 mmol/L (ref 3.5–5.1)
Sodium: 138 mmol/L (ref 135–145)

## 2021-05-23 LAB — CBC
HCT: 44.3 % (ref 36.0–46.0)
Hemoglobin: 13.4 g/dL (ref 12.0–15.0)
MCH: 26.7 pg (ref 26.0–34.0)
MCHC: 30.2 g/dL (ref 30.0–36.0)
MCV: 88.2 fL (ref 80.0–100.0)
Platelets: 239 10*3/uL (ref 150–400)
RBC: 5.02 MIL/uL (ref 3.87–5.11)
RDW: 13.7 % (ref 11.5–15.5)
WBC: 5.7 10*3/uL (ref 4.0–10.5)
nRBC: 0 % (ref 0.0–0.2)

## 2021-05-23 LAB — URINALYSIS, ROUTINE W REFLEX MICROSCOPIC
Bilirubin Urine: NEGATIVE
Glucose, UA: NEGATIVE mg/dL
Hgb urine dipstick: NEGATIVE
Ketones, ur: NEGATIVE mg/dL
Nitrite: NEGATIVE
Protein, ur: NEGATIVE mg/dL
Specific Gravity, Urine: 1.006 (ref 1.005–1.030)
pH: 6 (ref 5.0–8.0)

## 2021-05-23 LAB — TROPONIN I (HIGH SENSITIVITY): Troponin I (High Sensitivity): 5 ng/L (ref <18)

## 2021-05-23 LAB — LIPASE, BLOOD: Lipase: 40 U/L (ref 11–51)

## 2021-05-23 NOTE — ED Triage Notes (Signed)
Pt arrives via EMS from home with N/V/D for 2 days. Pt taking pepto. Started having dull central CP today. Pt has not taken daily meds today.

## 2021-05-24 LAB — TROPONIN I (HIGH SENSITIVITY): Troponin I (High Sensitivity): 4 ng/L (ref <18)

## 2021-05-24 MED ORDER — ONDANSETRON 4 MG PO TBDP
4.0000 mg | ORAL_TABLET | Freq: Once | ORAL | Status: AC
  Administered 2021-05-24: 4 mg via ORAL
  Filled 2021-05-24: qty 1

## 2021-05-24 MED ORDER — ALUM & MAG HYDROXIDE-SIMETH 200-200-20 MG/5ML PO SUSP
30.0000 mL | Freq: Once | ORAL | Status: AC
  Administered 2021-05-24: 30 mL via ORAL
  Filled 2021-05-24: qty 30

## 2021-05-24 NOTE — ED Notes (Signed)
Pt provided discharge instructions and prescription information. Pt was given the opportunity to ask questions and questions were answered. Discharge signature not obtained in the setting of the COVID-19 pandemic in order to reduce high touch surfaces.  ° °

## 2021-05-24 NOTE — ED Provider Notes (Signed)
Emergency Medicine Provider Triage Evaluation Note  Deborah Reid , a 70 y.o. female  was evaluated in triage.  Pt complains of chest discomfort onset today. Also reports flushing that has been ongoing for years after starting amlodipine. Pain has improved at the time of my eval.  Review of Systems  Positive: Chest pain Negative: cough  Physical Exam  BP 121/79    Pulse 89    Temp 97.8 F (36.6 C) (Oral)    Resp 16    SpO2 99%  Gen:   Awake, no distress   Resp:  Normal effort  MSK:   Moves extremities without difficulty  Other:  Heart with rrr, lungs ctab  Medical Decision Making  Medically screening exam initiated at 12:57 AM.  Appropriate orders placed.  [name] was informed that the remainder of the evaluation will be completed by another provider, this initial triage assessment does not replace that evaluation, and the importance of remaining in the ED until their evaluation is complete.     Rodney Booze, PA-C 05/24/21 7672    Veryl Speak, MD 05/24/21 [phone]

## 2021-05-24 NOTE — ED Notes (Signed)
The pt is asleep snoring

## 2021-05-24 NOTE — Progress Notes (Signed)
°   05/24/21 0959  TOC ED Mini Assessment  TOC Time spent with patient (minutes): 15  PING Used in TOC Assessment No  Admission or Readmission Diverted Yes  Interventions which prevented an admission or readmission Transportation Screening  What brought you to the Emergency Department?  Nausea, vomiting, diarhhea  Barriers to Discharge ED Transportation  Barrier interventions taxi voucher  Means of departure Taxi

## 2021-05-24 NOTE — Discharge Instructions (Addendum)
You were evaluated in the Emergency Department and after careful evaluation, we did not find any emergent condition requiring admission or further testing in the hospital.  Your exam/testing today is overall reassuring.  Please return to the Emergency Department if you experience any worsening of your condition.   Thank you for allowing us to be a part of your care. 

## 2021-05-24 NOTE — ED Provider Notes (Signed)
[facility] Emergency Department Provider Note MRN:  [id number]  Arrival date & time: 05/24/21     Chief Complaint   Nausea and Chest Pain   History of Present Illness   Deborah Reid is a 70 y.o. year-old female with a history of hypertension, RA presenting to the ED with chief complaint of nausea and chest pain.  Burning chest pain starting 1 to 2 hours prior to arrival.  Also having some burning throughout her body which is chronic and she believes it is related to her amlodipine medication.  Denies dizziness or diaphoresis.  Does endorse nausea but no vomiting, no shortness of breath.  No leg pain or swelling.  No abdominal pain.  Symptoms constant, mild, no exacerbating or relieving factors.  Review of Systems  A complete 10 system review of systems was obtained and all systems are negative except as noted in the HPI and PMH.   Patient's Health History    Past Medical History:  Diagnosis Date   Asthma    Depression    GERD (gastroesophageal reflux disease)    Glaucoma    Hepatitis C    Hypertension    Multiple sclerosis ([place])    Rheumatoid arthritis ([place])     Past Surgical History:  Procedure Laterality Date   CHOLECYSTECTOMY      Family History  Problem Relation Age of Onset   Arthritis Mother    Depression Mother    Alcohol abuse Father    Arthritis Father    Transient ischemic attack Sister    Cancer Brother        prostate cancer   Arthritis Sister    Lupus Sister    Hypertension Sister    Heart disease Maternal Grandmother    Hypertension Maternal Grandmother    Diabetes Maternal Grandfather    Hypertension Paternal Grandmother     Social History   Socioeconomic History   Marital status: Single    Spouse name: Not on file   Number of children: Not on file   Years of education: 12   Highest education level: Not on file  Occupational History   Occupation: Disability    Employer: UNEMPLOYED    Comment: Multiple Sclerosis   Tobacco Use   Smoking status: Former    Types: Cigarettes    Quit date: 06/20/1973    Years since quitting: 47.9   Smokeless tobacco: Never  Vaping Use   Vaping Use: Never used  Substance and Sexual Activity   Alcohol use: No   Drug use: No   Sexual activity: Not on file  Other Topics Concern   Not on file  Social History Narrative   Not on file   Social Determinants of Health   Financial Resource Strain: Not on file  Food Insecurity: Not on file  Transportation Needs: Not on file  Physical Activity: Not on file  Stress: Not on file  Social Connections: Not on file  Intimate Partner Violence: Not on file     Physical Exam   Vitals:   05/24/21 0515 05/24/21 0700  BP: (!) 109/51 (!) 108/59  Pulse: 80 82  Resp: (!) 21 17  Temp:    SpO2: 95% 94%    CONSTITUTIONAL: Well-appearing, NAD NEURO:  Alert and oriented x 3, no focal deficits EYES:  eyes equal and reactive ENT/NECK:  no LAD, no JVD CARDIO: Regular rate, well-perfused, normal S1 and S2 PULM:  CTAB no wheezing or rhonchi GI/GU:  normal bowel sounds, non-distended,  non-tender MSK/SPINE:  No gross deformities, no edema SKIN:  no rash, atraumatic PSYCH:  Appropriate speech and behavior  *Additional and/or pertinent findings included in MDM below  Diagnostic and Interventional Summary    EKG Interpretation  Date/Time:  Friday May 23 2021 17:22:13 EST Ventricular Rate:  78 PR Interval:  134 QRS Duration: 68 QT Interval:  374 QTC Calculation: 426 R Axis:   1 Text Interpretation: Normal sinus rhythm Cannot rule out Anterior infarct , age undetermined Abnormal ECG When compared with ECG of 22-Jan-2018 08:00, PREVIOUS ECG IS PRESENT Confirmed by Gerlene Fee [phone]) on 05/24/2021 4:04:10 AM       Labs Reviewed  URINALYSIS, ROUTINE W REFLEX MICROSCOPIC - Abnormal; Notable for the following components:      Result Value   Leukocytes,Ua TRACE (*)    Bacteria, UA RARE (*)    All other components  within normal limits  BASIC METABOLIC PANEL  CBC  LIPASE, BLOOD  TROPONIN I (HIGH SENSITIVITY)  TROPONIN I (HIGH SENSITIVITY)  TROPONIN I (HIGH SENSITIVITY)    DG Chest 2 View  Final Result      Medications  alum & mag hydroxide-simeth (MAALOX/MYLANTA) 200-200-20 MG/5ML suspension 30 mL (30 mLs Oral Given 05/24/21 0518)  ondansetron (ZOFRAN-ODT) disintegrating tablet 4 mg (4 mg Oral Given 05/24/21 0518)     Procedures  /  Critical Care Procedures  ED Course and Medical Decision Making  I have reviewed the triage vital signs, the nursing notes, and pertinent available records from the EMR.  Listed above are laboratory and imaging tests that I personally ordered, reviewed, and interpreted and then considered in my medical decision making (see below for details).  Overall doubt cardiac etiology.  EKG is reassuring, first troponin is negative.  Burning chest pain, favoring GI cause, providing Maalox, Zofran and will reassess.  Awaiting second troponin.       Barth Kirks. Sedonia Small, MD [place] [email or web address]  Final Clinical Impressions(s) / ED Diagnoses     ICD-10-CM   1. Chest pain, unspecified type  R07.9       ED Discharge Orders     None        Discharge Instructions Discussed with and Provided to Patient:     Discharge Instructions      You were evaluated in the Emergency Department and after careful evaluation, we did not find any emergent condition requiring admission or further testing in the hospital.  Your exam/testing today is overall reassuring.  Please return to the Emergency Department if you experience any worsening of your condition.   Thank you for allowing [place] to be a part of your care.        Maudie Flakes, MD 05/24/21 [phone]

## 2021-05-24 NOTE — ED Provider Notes (Signed)
Follow second troponin, if negative appropriate for discharge. Physical Exam  BP (!) 108/59    Pulse 82    Temp 98 F (36.7 C) (Oral)    Resp 17    SpO2 94%   Physical Exam  ED Course/Procedures     Procedures  MDM         Charlesetta Shanks, MD 05/24/21 [phone]

## 2021-06-23 ENCOUNTER — Encounter (HOSPITAL_COMMUNITY): Payer: Self-pay | Admitting: Radiology

## 2021-07-30 ENCOUNTER — Encounter (HOSPITAL_COMMUNITY): Payer: Self-pay | Admitting: Emergency Medicine

## 2021-07-30 ENCOUNTER — Inpatient Hospital Stay (HOSPITAL_COMMUNITY)
Admission: EM | Admit: 2021-07-30 | Discharge: 2021-08-03 | DRG: 137 | Disposition: A | Discharge status: Home / Self Care | Payer: Medicare HMO | Attending: Internal Medicine | Admitting: Internal Medicine

## 2021-07-30 ENCOUNTER — Other Ambulatory Visit: Payer: Self-pay

## 2021-07-30 ENCOUNTER — Emergency Department (HOSPITAL_COMMUNITY): Payer: Medicare HMO

## 2021-07-30 DIAGNOSIS — N179 Acute kidney failure, unspecified: Secondary | ICD-10-CM | POA: Diagnosis not present

## 2021-07-30 DIAGNOSIS — R630 Anorexia: Secondary | ICD-10-CM | POA: Diagnosis present

## 2021-07-30 DIAGNOSIS — K219 Gastro-esophageal reflux disease without esophagitis: Secondary | ICD-10-CM | POA: Diagnosis present

## 2021-07-30 DIAGNOSIS — R252 Cramp and spasm: Secondary | ICD-10-CM | POA: Diagnosis present

## 2021-07-30 DIAGNOSIS — Z8619 Personal history of other infectious and parasitic diseases: Secondary | ICD-10-CM

## 2021-07-30 DIAGNOSIS — Z20822 Contact with and (suspected) exposure to covid-19: Secondary | ICD-10-CM | POA: Diagnosis present

## 2021-07-30 DIAGNOSIS — Z6834 Body mass index (BMI) 34.0-34.9, adult: Secondary | ICD-10-CM

## 2021-07-30 DIAGNOSIS — G35 Multiple sclerosis: Secondary | ICD-10-CM | POA: Diagnosis present

## 2021-07-30 DIAGNOSIS — Z8249 Family history of ischemic heart disease and other diseases of the circulatory system: Secondary | ICD-10-CM

## 2021-07-30 DIAGNOSIS — F32A Depression, unspecified: Secondary | ICD-10-CM | POA: Diagnosis present

## 2021-07-30 DIAGNOSIS — K047 Periapical abscess without sinus: Secondary | ICD-10-CM | POA: Diagnosis not present

## 2021-07-30 DIAGNOSIS — Z9049 Acquired absence of other specified parts of digestive tract: Secondary | ICD-10-CM

## 2021-07-30 DIAGNOSIS — Z818 Family history of other mental and behavioral disorders: Secondary | ICD-10-CM

## 2021-07-30 DIAGNOSIS — Z79899 Other long term (current) drug therapy: Secondary | ICD-10-CM

## 2021-07-30 DIAGNOSIS — L03211 Cellulitis of face: Secondary | ICD-10-CM | POA: Diagnosis not present

## 2021-07-30 DIAGNOSIS — Z7982 Long term (current) use of aspirin: Secondary | ICD-10-CM

## 2021-07-30 DIAGNOSIS — M069 Rheumatoid arthritis, unspecified: Secondary | ICD-10-CM | POA: Diagnosis present

## 2021-07-30 DIAGNOSIS — J45909 Unspecified asthma, uncomplicated: Secondary | ICD-10-CM | POA: Diagnosis present

## 2021-07-30 DIAGNOSIS — I10 Essential (primary) hypertension: Secondary | ICD-10-CM | POA: Diagnosis present

## 2021-07-30 DIAGNOSIS — Z87891 Personal history of nicotine dependence: Secondary | ICD-10-CM

## 2021-07-30 DIAGNOSIS — Z8261 Family history of arthritis: Secondary | ICD-10-CM

## 2021-07-30 LAB — BASIC METABOLIC PANEL
Anion gap: 11 (ref 5–15)
BUN: 27 mg/dL — ABNORMAL HIGH (ref 8–23)
CO2: 21 mmol/L — ABNORMAL LOW (ref 22–32)
Calcium: 9.3 mg/dL (ref 8.9–10.3)
Chloride: 107 mmol/L (ref 98–111)
Creatinine, Ser: 1.68 mg/dL — ABNORMAL HIGH (ref 0.44–1.00)
GFR, Estimated: 32 mL/min — ABNORMAL LOW (ref >60)
Glucose, Bld: 112 mg/dL — ABNORMAL HIGH (ref 70–99)
Potassium: 3.7 mmol/L (ref 3.5–5.1)
Sodium: 139 mmol/L (ref 135–145)

## 2021-07-30 LAB — CBC WITH DIFFERENTIAL/PLATELET
Abs Immature Granulocytes: 0.01 10*3/uL (ref 0.00–0.07)
Basophils Absolute: 0 10*3/uL (ref 0.0–0.1)
Basophils Relative: 1 %
Eosinophils Absolute: 0.2 10*3/uL (ref 0.0–0.5)
Eosinophils Relative: 2 %
HCT: 40.5 % (ref 36.0–46.0)
Hemoglobin: 13 g/dL (ref 12.0–15.0)
Immature Granulocytes: 0 %
Lymphocytes Relative: 32 %
Lymphs Abs: 2.3 10*3/uL (ref 0.7–4.0)
MCH: 27.8 pg (ref 26.0–34.0)
MCHC: 32.1 g/dL (ref 30.0–36.0)
MCV: 86.5 fL (ref 80.0–100.0)
Monocytes Absolute: 0.8 10*3/uL (ref 0.1–1.0)
Monocytes Relative: 11 %
Neutro Abs: 3.9 10*3/uL (ref 1.7–7.7)
Neutrophils Relative %: 54 %
Platelets: 239 10*3/uL (ref 150–400)
RBC: 4.68 MIL/uL (ref 3.87–5.11)
RDW: 13.3 % (ref 11.5–15.5)
WBC: 7.2 10*3/uL (ref 4.0–10.5)
nRBC: 0 % (ref 0.0–0.2)

## 2021-07-30 LAB — LACTIC ACID, PLASMA: Lactic Acid, Venous: 1 mmol/L (ref 0.5–1.9)

## 2021-07-30 LAB — RESP PANEL BY RT-PCR (FLU A&B, COVID) ARPGX2
Influenza A by PCR: NEGATIVE
Influenza B by PCR: NEGATIVE
SARS Coronavirus 2 by RT PCR: NEGATIVE

## 2021-07-30 MED ORDER — PANTOPRAZOLE SODIUM 40 MG PO TBEC
40.0000 mg | DELAYED_RELEASE_TABLET | Freq: Every day | ORAL | Status: DC
Start: 2021-07-31 — End: 2021-08-03
  Administered 2021-07-31 – 2021-08-03 (×3): 40 mg via ORAL
  Filled 2021-07-30 (×3): qty 1

## 2021-07-30 MED ORDER — ACETAMINOPHEN 650 MG RE SUPP: 650.0000 mg | Freq: Four times a day (QID) | RECTAL | Status: DC | PRN

## 2021-07-30 MED ORDER — CLINDAMYCIN PHOSPHATE 600 MG/50ML IV SOLN
600.0000 mg | Freq: Three times a day (TID) | INTRAVENOUS | Status: DC
  Administered 2021-07-31 (×2): 600 mg via INTRAVENOUS
  Filled 2021-07-30 (×2): qty 50

## 2021-07-30 MED ORDER — ONDANSETRON HCL 4 MG/2ML IJ SOLN
4.0000 mg | Freq: Four times a day (QID) | INTRAMUSCULAR | Status: DC | PRN
  Administered 2021-08-01: 4 mg via INTRAVENOUS

## 2021-07-30 MED ORDER — VITAMIN D 25 MCG (1000 UNIT) PO TABS
1000.0000 [IU] | ORAL_TABLET | Freq: Every day | ORAL | Status: DC
  Administered 2021-07-31 – 2021-08-03 (×3): 1000 [IU] via ORAL
  Filled 2021-07-30 (×3): qty 1

## 2021-07-30 MED ORDER — ESCITALOPRAM OXALATE 10 MG PO TABS
10.0000 mg | ORAL_TABLET | Freq: Every day | ORAL | Status: DC
  Administered 2021-07-31 – 2021-08-02 (×3): 10 mg via ORAL
  Filled 2021-07-30 (×3): qty 1

## 2021-07-30 MED ORDER — SODIUM CHLORIDE 0.9 % IV BOLUS
1000.0000 mL | Freq: Once | INTRAVENOUS | Status: AC
  Administered 2021-07-30: 1000 mL via INTRAVENOUS

## 2021-07-30 MED ORDER — ASPIRIN EC 81 MG PO TBEC: 81.0000 mg | DELAYED_RELEASE_TABLET | Freq: Every day | ORAL | Status: DC | PRN

## 2021-07-30 MED ORDER — CLINDAMYCIN PHOSPHATE 600 MG/50ML IV SOLN
600.0000 mg | Freq: Once | INTRAVENOUS | Status: AC
  Administered 2021-07-30: 600 mg via INTRAVENOUS
  Filled 2021-07-30: qty 50

## 2021-07-30 MED ORDER — GABAPENTIN 300 MG PO CAPS
300.0000 mg | ORAL_CAPSULE | Freq: Every day | ORAL | Status: DC
  Filled 2021-07-30: qty 1

## 2021-07-30 MED ORDER — TRAZODONE HCL 50 MG PO TABS
25.0000 mg | ORAL_TABLET | Freq: Every evening | ORAL | Status: DC | PRN
  Filled 2021-07-30: qty 1

## 2021-07-30 MED ORDER — MAGNESIUM HYDROXIDE 400 MG/5ML PO SUSP: 30.0000 mL | Freq: Every day | ORAL | Status: DC | PRN

## 2021-07-30 MED ORDER — ENOXAPARIN SODIUM 40 MG/0.4ML IJ SOSY
40.0000 mg | PREFILLED_SYRINGE | INTRAMUSCULAR | Status: DC
  Administered 2021-07-31 – 2021-08-03 (×3): 40 mg via SUBCUTANEOUS
  Filled 2021-07-30 (×3): qty 0.4

## 2021-07-30 MED ORDER — ADULT MULTIVITAMIN W/MINERALS CH
1.0000 | ORAL_TABLET | Freq: Every day | ORAL | Status: DC
  Administered 2021-07-31 – 2021-08-03 (×3): 1 via ORAL
  Filled 2021-07-30 (×3): qty 1

## 2021-07-30 MED ORDER — ONDANSETRON HCL 4 MG PO TABS: 4.0000 mg | ORAL_TABLET | Freq: Four times a day (QID) | ORAL | Status: DC | PRN

## 2021-07-30 MED ORDER — IOHEXOL 300 MG/ML  SOLN
55.0000 mL | Freq: Once | INTRAMUSCULAR | Status: AC | PRN
  Administered 2021-07-30: 55 mL via INTRAVENOUS

## 2021-07-30 MED ORDER — SODIUM CHLORIDE 0.9 % IV SOLN: INTRAVENOUS | Status: DC

## 2021-07-30 MED ORDER — ACETAMINOPHEN 325 MG PO TABS
650.0000 mg | ORAL_TABLET | Freq: Four times a day (QID) | ORAL | Status: DC | PRN
  Administered 2021-07-31 – 2021-08-03 (×2): 650 mg via ORAL
  Filled 2021-07-30 (×3): qty 2

## 2021-07-30 NOTE — Assessment & Plan Note (Addendum)
-   The patient be admitted to a medical bed. ?- Pain management will be provided. ?- We will continue her on IV clindamycin. ?- Warm compresses will be applied. ?- Consult for Dr. Meade Maw can be obtained in a.m. ?

## 2021-07-30 NOTE — Assessment & Plan Note (Addendum)
-   This is on the left lower jaw and is likely the culprit for her adjacent left facial cellulitis. ?- Management as above. ?- Oral surgery consult can be obtained in a.m. ?

## 2021-07-30 NOTE — Assessment & Plan Note (Addendum)
-   We will continue Lexapro and trazodone. ?

## 2021-07-30 NOTE — Assessment & Plan Note (Signed)
-   We will continue PPI therapy 

## 2021-07-30 NOTE — H&P (Signed)
[place]   PATIENT NAME: Deborah Reid    MR#:  [id number]  DATE OF BIRTH:  [birth year]  DATE OF ADMISSION:  07/30/2021  PRIMARY CARE PHYSICIAN: Cipriano Mile, NP   Patient is coming from: Home   REQUESTING/REFERRING PHYSICIAN: Tacy Learn, PA-C  CHIEF COMPLAINT:   Chief Complaint  Patient presents with   Facial Swelling    Pain    HISTORY OF PRESENT ILLNESS:  Deborah Reid is a 71 y.o. African-American female with medical history significant for asthma, depression, GERD, multiple sclerosis, hepatitis C and rheumatoid arthritis, who presents to the emergency room because of the left facial swelling and pain which have been worsening over the last couple weeks.  She admitted to tooth ache on the lower jaw as well as tactile fever and chills.  She denies any nausea or vomiting however has been having significantly diminished appetite and decreased p.o. intake.  She denies any chest pain or dyspnea or cough or wheezing.  No dysuria, oliguria or hematuria or flank pain.  No headache or dizziness or blurred vision.  She has not seen a dentist for this problem since it started.  ED Course: When she came to the ER, vital signs were within normal.  Labs revealed a BUN of 27 and creatinine 1.68 with a CO2 of 21 and CBC was within normal.  Lactic acid was 1.  Influenza antigens and COVID-19 PCR came back negative.  Imaging: CT soft tissue neck with contrast revealed the following: 1.5 x 0.8 x 1.5 cm odontogenic abscess positioned along the posterior left mandibular body. Associated soft tissue swelling and inflammatory stranding within the adjacent left masticator and submandibular spaces consistent with regional cellulitis. Periapical lucency noted about the root of the left second mandibular molar, suspected to be the source of infection.  The patient was given IV clindamycin and 1 L bolus of IV normal saline.  She will be admitted to an observation medical bed for further  evaluation and management.  PAST MEDICAL HISTORY:   Past Medical History:  Diagnosis Date   Asthma    Depression    GERD (gastroesophageal reflux disease)    Glaucoma    Hepatitis C    Hypertension    Multiple sclerosis ([place])    Rheumatoid arthritis ([place])     PAST SURGICAL HISTORY:   Past Surgical History:  Procedure Laterality Date   CHOLECYSTECTOMY      SOCIAL HISTORY:   Social History   Tobacco Use   Smoking status: Former    Types: Cigarettes    Quit date: 06/20/1973    Years since quitting: 48.1   Smokeless tobacco: Never  Substance Use Topics   Alcohol use: No    FAMILY HISTORY:   Family History  Problem Relation Age of Onset   Arthritis Mother    Depression Mother    Alcohol abuse Father    Arthritis Father    Transient ischemic attack Sister    Cancer Brother        prostate cancer   Arthritis Sister    Lupus Sister    Hypertension Sister    Heart disease Maternal Grandmother    Hypertension Maternal Grandmother    Diabetes Maternal Grandfather    Hypertension Paternal Grandmother     DRUG ALLERGIES:  No Known Allergies  REVIEW OF SYSTEMS:   ROS As per history of present illness. All pertinent systems were reviewed above. Constitutional, HEENT, cardiovascular, respiratory, GI, GU, musculoskeletal, neuro,  psychiatric, endocrine, integumentary and hematologic systems were reviewed and are otherwise negative/unremarkable except for positive findings mentioned above in the HPI.   MEDICATIONS AT HOME:   Prior to Admission medications   Medication Sig Start Date End Date Taking? Authorizing Provider  aspirin EC 81 MG tablet Take 1 tablet (81 mg total) by mouth daily. Patient taking differently: Take 81 mg by mouth daily as needed for mild pain.  11/15/17   Hosie Poisson, MD  BETA CAROTENE PO Take 1 capsule by mouth daily.    [provider]  cholecalciferol (VITAMIN D) 1000 UNITS tablet Take 1 tablet (1,000 Units total) by mouth daily.  09/18/13   Rey, Latina Craver, NP  escitalopram (LEXAPRO) 10 MG tablet Take 1 tablet (10 mg total) by mouth at bedtime. 09/18/13   Rey, Latina Craver, NP  gabapentin (NEURONTIN) 100 MG capsule Take 1 capsule (100 mg total) by mouth 2 (two) times daily. 11/15/17   Hosie Poisson, MD  Multiple Vitamin (MULTIVITAMIN) tablet Take 1 tablet by mouth daily. Centrum.    [provider]  omeprazole (PRILOSEC) 40 MG capsule Take 40 mg by mouth daily.    [provider]  traZODone (DESYREL) 50 MG tablet Take 1 tablet (50 mg total) by mouth at bedtime. Patient taking differently: Take 25 mg by mouth at bedtime as needed for sleep.  09/18/13   Rey, Latina Craver, NP      VITAL SIGNS:  Blood pressure (!) 116/46, pulse 79, temperature 98.9 F (37.2 C), temperature source Oral, resp. rate 16, SpO2 100 %.  PHYSICAL EXAMINATION:  Physical Exam  GENERAL:  71 y.o.-year-old African-American patient lying in the bed with no acute distress.  EYES: Pupils equal, round, reactive to light and accommodation. No scleral icterus. Extraocular muscles intact.  HEENT: Head atraumatic, normocephalic. Oropharynx with left lower jaw tenderness and nasopharynx clear.  NECK:  Supple, no jugular venous distention. No thyroid enlargement, no tenderness.  LUNGS: Normal breath sounds bilaterally, no wheezing, rales,rhonchi or crepitation. No use of accessory muscles of respiration.  CARDIOVASCULAR: Regular rate and rhythm, S1, S2 normal. No murmurs, rubs, or gallops.  ABDOMEN: Soft, nondistended, nontender. Bowel sounds present. No organomegaly or mass.  EXTREMITIES: No pedal edema, cyanosis, or clubbing.  NEUROLOGIC: Cranial nerves II through XII are intact. Muscle strength 5/5 in all extremities. Sensation intact. Gait not checked.  PSYCHIATRIC: The patient is alert and oriented x 3.  Normal affect and good eye contact. SKIN: Left facial swelling, warmth and tenderness around the lower angle of the mandible LABORATORY PANEL:    CBC Recent Labs  Lab 07/30/21 1637  WBC 7.2  HGB 13.0  HCT 40.5  PLT 239   ------------------------------------------------------------------------------------------------------------------  Chemistries  Recent Labs  Lab 07/30/21 1637  NA 139  K 3.7  CL 107  CO2 21*  GLUCOSE 112*  BUN 27*  CREATININE 1.68*  CALCIUM 9.3   ------------------------------------------------------------------------------------------------------------------  Cardiac Enzymes No results for input(s): TROPONINI in the last 168 hours. ------------------------------------------------------------------------------------------------------------------  RADIOLOGY:  CT Soft Tissue Neck W Contrast  Result Date: 07/30/2021 CLINICAL DATA:  Initial evaluation for acute left-sided facial and neck swelling for 1 week. EXAM: CT NECK WITH CONTRAST TECHNIQUE: Multidetector CT imaging of the neck was performed using the standard protocol following the bolus administration of intravenous contrast. RADIATION DOSE REDUCTION: This exam was performed according to the departmental dose-optimization program which includes automated exposure control, adjustment of the mA and/or kV according to patient size and/or use of iterative reconstruction technique. CONTRAST:  52m OMNIPAQUE IOHEXOL 300 MG/ML  SOLN COMPARISON:  None available. FINDINGS: Pharynx and larynx: Oral cavity within normal limits. Periapical lucency noted about the left second mandibular molar. There is soft tissue swelling with inflammatory stranding within the adjacent left masticator space, concerning for acute infection/cellulitis. Mild extension into the left submandibular space as well. Superimposed rim enhancing hypodense collection positioned along the posterior left mandibular body measures 1.5 x 0.8 x 1.5 cm, consistent with an odontogenic abscess (series 3, image 59). Oropharynx and nasopharynx within normal limits. No retropharyngeal collection or  swelling. Epiglottis grossly within normal limits. Vallecula is effaced. Hypopharynx and supraglottic larynx demonstrate no other acute finding. Glottis within normal limits. Subglottic airway clear. Salivary glands: Salivary glands including the parotid and submandibular glands are within normal limits. Thyroid: Normal. Lymph nodes: Mildly asymmetric prominence of subcentimeter upper left cervical lymph nodes, presumably reactive. No other enlarged or pathologic adenopathy within the neck. Vascular: Normal intravascular enhancement seen throughout the neck. Mild atheromatous change about the arch, carotid bifurcations, and carotid siphons. Limited intracranial: Unremarkable. Visualized orbits: Unremarkable. Mastoids and visualized paranasal sinuses: Mild mucoperiosteal thickening present about the ethmoidal air cells. Paranasal sinuses are otherwise clear. Mastoid air cells and middle ear cavities are well pneumatized and free of fluid. Skeleton: No discrete or worrisome osseous lesions. Mild cervical spondylosis for age. Upper chest: Mild scattered subsegmental atelectatic changes seen within the visualized upper lungs. Visualized upper chest demonstrates no other acute finding. Other: None. IMPRESSION: 1.5 x 0.8 x 1.5 cm odontogenic abscess positioned along the posterior left mandibular body. Associated soft tissue swelling and inflammatory stranding within the adjacent left masticator and submandibular spaces consistent with regional cellulitis. Periapical lucency noted about the root of the left second mandibular molar, suspected to be the source of infection. Electronically Signed   By: BJeannine BogaM.D.   On: 07/30/2021 20:20      IMPRESSION AND PLAN:  Assessment and Plan: * Facial cellulitis - The patient be admitted to a medical bed. - Pain management will be provided. - We will continue her on IV clindamycin. - Warm compresses will be applied. - Consult for Dr. RMeade Mawcan be  obtained in a.m.  Dental abscess - This is on the left lower jaw and is likely the culprit for her adjacent left facial cellulitis. - Management as above. - Oral surgery consult can be obtained in a.m.  AKI (acute kidney injury) ([place] - This is clearly secondary to anorexia and diminished p.o. intake. - She will be hydrated with IV normal saline and will follow BMP. - We will hold off any nephrotoxins.  GERD (gastroesophageal reflux disease) - We will continue PPI therapy.  Depression - We will continue Lexapro and trazodone.  [place] - She is apparently in remission. - We will continue Neurontin.  DVT prophylaxis: Lovenox.  Advanced Care Planning:  Code Status: full code.  Family Communication:  The plan of care was discussed in details with the patient (and family). I answered all questions. The patient agreed to proceed with the above mentioned plan. Further management will depend upon hospital course. Disposition Plan: Back to previous home environment Consults called: Dental surgery consult can be called in AM. All the records are reviewed and case discussed with ED provider.  Status is: Observation  I certify that at the time of admission, it is my clinical judgment that the patient will require hospital care extending less than 2 midnights.  Dispo: The patient is from: Home              Anticipated d/c is to: Home              Patient currently is not medically stable to d/c.              Difficult to place patient: No  Christel Mormon M.D on 07/30/2021 at 9:55 PM  [facility]   From 7 PM-7 AM, contact night-coverage [email or web address]  CC: Primary care physician; Cipriano Mile, NP

## 2021-07-30 NOTE — ED Triage Notes (Signed)
Pt here from home with c/o face pain times 2 weeks  swelling to the left side her dentist started her on some antibiotics but unsure how many doses that she has had  ?

## 2021-07-30 NOTE — ED Provider Triage Note (Signed)
Emergency Medicine Provider Triage Evaluation Note ? ?Deborah Reid , a 71 y.o. female  was evaluated in triage.  Pt complains of left sided face swelling for the last week, placed on abx by dentistry, has been taking them for 4-7 days. Endorses fever, chills. Pain with swallowing, but currently tolerating secretions. ? ?Review of Systems  ?Positive: Mouth pain, swelling, fever, chills, dif swallowing ?Negative: Inability to swallow secretions ? ?Physical Exam  ?BP 118/67 (BP Location: Right Arm)   Pulse 86   Temp 97.7 ?F (36.5 ?C) (Oral)   Resp 16   SpO2 98%  ?Gen:   Awake, no distress   ?Resp:  Normal effort  ?MSK:   Moves extremities without difficulty  ?Other:  Significant left sided buccal mucosa, left neck swelling, patient with significant trismus ? ?Medical Decision Making  ?Medically screening exam initiated at 4:29 PM.  Appropriate orders placed.  Deborah Reid was informed that the remainder of the evaluation will be completed by another provider, this initial triage assessment does not replace that evaluation, and the importance of remaining in the ED until their evaluation is complete. ? ?Workup initiated ?Charge nurse informed roomed soon due to risk for airway compromise ?No current stridor or inability to tolerate secretions ? ?  ?Anselmo Pickler, PA-C ?07/30/21 1632 ? ?

## 2021-07-30 NOTE — Assessment & Plan Note (Signed)
-   She is apparently in remission. ?- We will continue Neurontin. ?

## 2021-07-30 NOTE — Assessment & Plan Note (Signed)
-   This is clearly secondary to anorexia and diminished p.o. intake. ?- She will be hydrated with IV normal saline and will follow BMP. ?- We will hold off any nephrotoxins. ?

## 2021-07-30 NOTE — ED Provider Notes (Signed)
[facility] EMERGENCY DEPARTMENT Provider Note   CSN: [id number] Arrival date & time: 07/30/21  1554     History  Chief Complaint  Patient presents with   Facial Swelling    Pain    Deborah Reid is a 71 y.o. female.  71 year old female with past medical history of MS, GERD, hypertension, asthma, hepatitis C presents with complaint of facial swelling and pain.  Patient states her symptoms started about 2 weeks ago primarily around where she used to have wisdom teeth on both sides.  Pain progressively worsening, more so on the left side.  Patient went to her dentist 1 week ago and was started on ?amoxicillin which she has been taking without improvement in her pain.  States it is difficult for her to open her mouth secondary to pain.  She denies fevers, chills, injury.  States that she has a foul taste in her mouth and questions if there is some drainage there.      Home Medications Prior to Admission medications   Medication Sig Start Date End Date Taking? Authorizing Provider  aspirin EC 81 MG tablet Take 1 tablet (81 mg total) by mouth daily. Patient taking differently: Take 81 mg by mouth daily as needed for mild pain.  11/15/17   Hosie Poisson, MD  BETA CAROTENE PO Take 1 capsule by mouth daily.    [provider]  cholecalciferol (VITAMIN D) 1000 UNITS tablet Take 1 tablet (1,000 Units total) by mouth daily. 09/18/13   Rey, Latina Craver, NP  escitalopram (LEXAPRO) 10 MG tablet Take 1 tablet (10 mg total) by mouth at bedtime. 09/18/13   Rey, Latina Craver, NP  gabapentin (NEURONTIN) 100 MG capsule Take 1 capsule (100 mg total) by mouth 2 (two) times daily. 11/15/17   Hosie Poisson, MD  Multiple Vitamin (MULTIVITAMIN) tablet Take 1 tablet by mouth daily. Centrum.    [provider]  omeprazole (PRILOSEC) 40 MG capsule Take 40 mg by mouth daily.    [provider]  traZODone (DESYREL) 50 MG tablet Take 1 tablet (50 mg total) by mouth at  bedtime. Patient taking differently: Take 25 mg by mouth at bedtime as needed for sleep.  09/18/13   Sherryl Barters, NP      Allergies    Patient has no known allergies.    Review of Systems   Review of Systems Negative except as per HPI Physical Exam Updated Vital Signs BP (!) 116/46    Pulse 79    Temp 98.9 F (37.2 C) (Oral)    Resp 16    SpO2 100%  Physical Exam Vitals and nursing note reviewed.  Constitutional:      General: She is not in acute distress.    Appearance: She is well-developed. She is not diaphoretic.  HENT:     Head: Normocephalic and atraumatic.     Jaw: Trismus present.     Comments: Approximately 5 mm trismus, very limited view in patient's mouth.  Submandibular space is soft.  No obvious odontogenic source.  Left cheek swelling more so than right, tenderness over the left parotid area without overlying erythema  Cardiovascular:     Rate and Rhythm: Normal rate and regular rhythm.     Heart sounds: Normal heart sounds.  Pulmonary:     Effort: Pulmonary effort is normal.     Breath sounds: Normal breath sounds.  Musculoskeletal:     Cervical back: Neck supple. No tenderness.     Right  lower leg: No edema.     Left lower leg: No edema.  Lymphadenopathy:     Cervical: No cervical adenopathy.  Skin:    General: Skin is warm and dry.     Findings: No erythema or rash.  Neurological:     Mental Status: She is alert and oriented to person, place, and time.  Psychiatric:        Behavior: Behavior normal.    ED Results / Procedures / Treatments   Labs (all labs ordered are listed, but only abnormal results are displayed) Labs Reviewed  BASIC METABOLIC PANEL - Abnormal; Notable for the following components:      Result Value   CO2 21 (*)    Glucose, Bld 112 (*)    BUN 27 (*)    Creatinine, Ser 1.68 (*)    GFR, Estimated 32 (*)    All other components within normal limits  RESP PANEL BY RT-PCR (FLU A&B, COVID) ARPGX2  CBC WITH DIFFERENTIAL/PLATELET   LACTIC ACID, PLASMA    EKG None  Radiology CT Soft Tissue Neck W Contrast  Result Date: 07/30/2021 CLINICAL DATA:  Initial evaluation for acute left-sided facial and neck swelling for 1 week. EXAM: CT NECK WITH CONTRAST TECHNIQUE: Multidetector CT imaging of the neck was performed using the standard protocol following the bolus administration of intravenous contrast. RADIATION DOSE REDUCTION: This exam was performed according to the departmental dose-optimization program which includes automated exposure control, adjustment of the mA and/or kV according to patient size and/or use of iterative reconstruction technique. CONTRAST:  72m OMNIPAQUE IOHEXOL 300 MG/ML  SOLN COMPARISON:  None available. FINDINGS: Pharynx and larynx: Oral cavity within normal limits. Periapical lucency noted about the left second mandibular molar. There is soft tissue swelling with inflammatory stranding within the adjacent left masticator space, concerning for acute infection/cellulitis. Mild extension into the left submandibular space as well. Superimposed rim enhancing hypodense collection positioned along the posterior left mandibular body measures 1.5 x 0.8 x 1.5 cm, consistent with an odontogenic abscess (series 3, image 59). Oropharynx and nasopharynx within normal limits. No retropharyngeal collection or swelling. Epiglottis grossly within normal limits. Vallecula is effaced. Hypopharynx and supraglottic larynx demonstrate no other acute finding. Glottis within normal limits. Subglottic airway clear. Salivary glands: Salivary glands including the parotid and submandibular glands are within normal limits. Thyroid: Normal. Lymph nodes: Mildly asymmetric prominence of subcentimeter upper left cervical lymph nodes, presumably reactive. No other enlarged or pathologic adenopathy within the neck. Vascular: Normal intravascular enhancement seen throughout the neck. Mild atheromatous change about the arch, carotid bifurcations, and  carotid siphons. Limited intracranial: Unremarkable. Visualized orbits: Unremarkable. Mastoids and visualized paranasal sinuses: Mild mucoperiosteal thickening present about the ethmoidal air cells. Paranasal sinuses are otherwise clear. Mastoid air cells and middle ear cavities are well pneumatized and free of fluid. Skeleton: No discrete or worrisome osseous lesions. Mild cervical spondylosis for age. Upper chest: Mild scattered subsegmental atelectatic changes seen within the visualized upper lungs. Visualized upper chest demonstrates no other acute finding. Other: None. IMPRESSION: 1.5 x 0.8 x 1.5 cm odontogenic abscess positioned along the posterior left mandibular body. Associated soft tissue swelling and inflammatory stranding within the adjacent left masticator and submandibular spaces consistent with regional cellulitis. Periapical lucency noted about the root of the left second mandibular molar, suspected to be the source of infection. Electronically Signed   By: BJeannine BogaM.D.   On: 07/30/2021 20:20    Procedures .Critical Care Performed by: MTacy Learn PA-C  Authorized by: Tacy Learn, PA-C   Critical care provider statement:    Critical care time (minutes):  30   Critical care was time spent personally by me on the following activities:  Development of treatment plan with patient or surrogate, discussions with consultants, evaluation of patient's response to treatment, examination of patient, ordering and review of laboratory studies, ordering and review of radiographic studies, ordering and performing treatments and interventions, pulse oximetry, re-evaluation of patient's condition and review of old charts    Medications Ordered in ED Medications  sodium chloride 0.9 % bolus 1,000 mL (0 mLs Intravenous Stopped 07/30/21 2036)  clindamycin (CLEOCIN) IVPB 600 mg (0 mg Intravenous Stopped 07/30/21 2036)  iohexol (OMNIPAQUE) 300 MG/ML solution 55 mL (55 mLs Intravenous  Contrast Given 07/30/21 1954)    ED Course/ Medical Decision Making/ A&P                           Medical Decision Making Risk Prescription drug management. Decision regarding hospitalization.   This patient presents to the ED for concern of left-sided facial swelling and pain, this involves an extensive number of treatment options, and is a complaint that carries with it a high risk of complications and morbidity.  The differential diagnosis includes but not limited to dental abscess, parotiditis, Ludwick's   Co morbidities that complicate the patient evaluation  MS, hepatitis C (chronic), RA, GERD   Additional history obtained:  External records from outside source obtained and reviewed including no recent relevant medical records on file   Lab Tests:  I Ordered, and personally interpreted labs.  The pertinent results include: CBC unremarkable, BMP with creatinine of 1.68, previously 0.9.  COVID/flu negative.  Lactic acid 1.0.   Imaging Studies ordered:  I ordered imaging studies including CT soft tissue neck  I agree with the radiologist interpretation- dental abscess    Medicines ordered and prescription drug management:  I ordered medication including IV fluids, clindamycin  for facial infection  Reevaluation of the patient after these medicines showed that the patient stayed the same I have reviewed the patients home medicines and have made adjustments as needed  Consultations Obtained:  I requested consultation with the hospitalist, Dr. Sidney Ace with [facility],  and discussed lab and imaging findings as well as pertinent plan - they recommend: admission   Problem List / ED Course:  71 year old female with complaint of left-sided facial swelling and pain as above.  Denies specific dental pain.  Seen by her dentist within the past week and started on antibiotics, questionably amoxicillin versus Augmentin.  Presents today with worsening pain and  swelling.  On exam, found to have trismus, possibly able to open mouth as much as 5 mm, limited oral exam due to this however submandibular space is soft, no neck tenderness.  She has tenderness with swelling over the left parotid area.  Patient was started on clindamycin and given IV fluids.  Found to have AKI, CT revealing dental abscess.  Discussed results with patient, she has not been able to eat and drink per usual due to facial pain and inability to open her mouth, suspect this is led to her AKI. Plan is for admission to the hospital for IV fluids and antibiotics, advised patient it is possible she will improve and follow-up outpatient with oral surgery for further treatment of her tooth versus possible consult with oral surgery while in the hospital tomorrow.  Oral surgery is not  available at this time for consult however is on-call tomorrow morning. Case discussed with hospitalist who is agreeable with plan for admission.         Final Clinical Impression(s) / ED Diagnoses Final diagnoses:  AKI (acute kidney injury) [facility])  Dental abscess    Rx / DC Orders ED Discharge Orders     None         Tacy Learn, PA-C 07/30/21 2058    Regan Lemming, MD 07/31/21 [phone]

## 2021-07-31 ENCOUNTER — Encounter (HOSPITAL_COMMUNITY)
Admission: EM | Disposition: A | Discharge status: Home / Self Care | Payer: Self-pay | Source: Home / Self Care | Attending: Internal Medicine

## 2021-07-31 ENCOUNTER — Observation Stay (HOSPITAL_COMMUNITY): Payer: Medicare HMO

## 2021-07-31 DIAGNOSIS — L03211 Cellulitis of face: Secondary | ICD-10-CM | POA: Diagnosis not present

## 2021-07-31 LAB — BASIC METABOLIC PANEL
Anion gap: 7 (ref 5–15)
BUN: 28 mg/dL — ABNORMAL HIGH (ref 8–23)
CO2: 21 mmol/L — ABNORMAL LOW (ref 22–32)
Calcium: 9.1 mg/dL (ref 8.9–10.3)
Chloride: 111 mmol/L (ref 98–111)
Creatinine, Ser: 1.4 mg/dL — ABNORMAL HIGH (ref 0.44–1.00)
GFR, Estimated: 40 mL/min — ABNORMAL LOW (ref >60)
Glucose, Bld: 93 mg/dL (ref 70–99)
Potassium: 5.1 mmol/L (ref 3.5–5.1)
Sodium: 139 mmol/L (ref 135–145)

## 2021-07-31 LAB — CBC
HCT: 39.7 % (ref 36.0–46.0)
Hemoglobin: 12.3 g/dL (ref 12.0–15.0)
MCH: 27.3 pg (ref 26.0–34.0)
MCHC: 31 g/dL (ref 30.0–36.0)
MCV: 88.2 fL (ref 80.0–100.0)
Platelets: 227 10*3/uL (ref 150–400)
RBC: 4.5 MIL/uL (ref 3.87–5.11)
RDW: 13.3 % (ref 11.5–15.5)
WBC: 7 10*3/uL (ref 4.0–10.5)
nRBC: 0 % (ref 0.0–0.2)

## 2021-07-31 SURGERY — DENTAL RESTORATION/EXTRACTIONS
Anesthesia: Choice

## 2021-07-31 MED ORDER — PREGABALIN 25 MG PO CAPS
50.0000 mg | ORAL_CAPSULE | Freq: Two times a day (BID) | ORAL | Status: DC
  Administered 2021-07-31 – 2021-08-03 (×6): 50 mg via ORAL
  Filled 2021-07-31 (×6): qty 2

## 2021-07-31 MED ORDER — AMPICILLIN-SULBACTAM SODIUM 3 (2-1) G IJ SOLR: 3.0000 g | INTRAMUSCULAR | Status: DC

## 2021-07-31 MED ORDER — CHLORHEXIDINE GLUCONATE CLOTH 2 % EX PADS
6.0000 | MEDICATED_PAD | Freq: Once | CUTANEOUS | Status: AC
Start: 2021-07-31 — End: 2021-08-01
  Administered 2021-08-01: 6 via TOPICAL

## 2021-07-31 MED ORDER — CHLORHEXIDINE GLUCONATE CLOTH 2 % EX PADS
6.0000 | MEDICATED_PAD | Freq: Once | CUTANEOUS | Status: AC
  Administered 2021-07-31: 23:00:00 6 via TOPICAL

## 2021-07-31 MED ORDER — SODIUM CHLORIDE 0.9 % IV SOLN
3.0000 g | Freq: Four times a day (QID) | INTRAVENOUS | Status: DC
  Administered 2021-07-31 – 2021-08-03 (×12): 3 g via INTRAVENOUS
  Filled 2021-07-31 (×13): qty 8

## 2021-07-31 NOTE — Progress Notes (Signed)
Pt out of the unit for an x ray. RN will re consult PIV start. ?

## 2021-07-31 NOTE — ED Notes (Signed)
Pt ambulated to the restroom independently with a walker, did well, no distress noted. ?

## 2021-07-31 NOTE — Progress Notes (Signed)
Pharmacy Antibiotic Note ? ?Deborah Reid is a 71 y.o. female admitted on 07/30/2021 with  odontogenic abscess .  Pharmacy has been consulted for ampicillin-sulbactam dosing. ? ?Plan: ?Start ampicillin-sulbactam (Unasyn) 3g IV q6h ?Monitor daily CBC, BMP, and temperature ?  ? ?Temp (24hrs), Avg:98.3 ?F (36.8 ?C), Min:97.7 ?F (36.5 ?C), Max:98.9 ?F (37.2 ?C) ? ?Recent Labs  ?Lab 07/30/21 ?1637 07/31/21 ?0324  ?WBC 7.2 7.0  ?CREATININE 1.68* 1.40*  ?LATICACIDVEN 1.0  --   ?  ?CrCl cannot be calculated (Unknown ideal weight.).  ?Estimated CrCl ~ 35-40 ml/min  ? ?No Known Allergies ? ?Antimicrobials this admission: ?Clindamycin 3/8 >> 3/9 ?Amp/Sulbactam  3/9 >>  ? ?Dose adjustments this admission: ?None ? ?Microbiology results: ?None  ? ?Thank you for allowing pharmacy to be a part of this patient?s care. ? ?Kaleen Mask ?07/31/2021 10:49 AM ? ?

## 2021-07-31 NOTE — Plan of Care (Signed)
  Problem: Activity: Goal: Risk for activity intolerance will decrease Outcome: Progressing   Problem: Safety: Goal: Ability to remain free from injury will improve Outcome: Progressing   Problem: Skin Integrity: Goal: Risk for impaired skin integrity will decrease Outcome: Progressing   

## 2021-07-31 NOTE — Progress Notes (Signed)
Pt admitted from the ED for admission. Pt oriented to room and dept. ?

## 2021-07-31 NOTE — Progress Notes (Signed)
?PROGRESS NOTE ? ?Deborah Reid  ?DOB: 1951/04/24  ?PCP: Cipriano Mile, NP ?PZW:[id number]  ?DOA: 07/30/2021 ? LOS: 0 days  ?Hospital Day: 2 ? ?Brief narrative: ?PIER LAUX is a 71 y.o. female with PMH significant for multiple sclerosis, rheumatoid arthritis, hepatitis C, HTN, GERD, asthma, depression. ?Patient presented to the ED on 3/8 with complaint of left facial swelling, pain, progressively worsening for the last couple weeks to the point where she now has significant limitation in opening her mouth. ?She was seen by a dentist on 3/2 and was referred to an oral surgeon but her symptoms progressed while waiting for the appointment. ? ?In the ED, patient was afebrile, and hemodynamically stable ?Labs with unremarkable CBC, BMP with creatinine elevated to 1.68 ?CT soft tissue neck with contrast showed an 1.5 x 0.8 x 1.5 cm odontogenic abscess positioned along the posterior left mandibular body. Associated soft tissue swelling and inflammatory stranding within the adjacent left masticator and submandibular spaces consistent with regional cellulitis. Periapical lucency noted about the root of the left second mandibular molar, suspected to be the source of infection. ? ?Patient was started on IV clindamycin and admitted to hospital service.  ? ?Subjective: ?Patient was seen and examined this morning.  Pleasant elderly African-American female.  Sitting up in bed.  Trying to take few Jell-O's.  She has significant trismus and unable to open her mouth completely.  I called and discussed the case with oral surgeon Dr. Conley Simmonds who suggested to switch antibiotics to IV Unasyn.  He will be seeing her as a consult today. ? ?Principal Problem: ?  Facial cellulitis ?Active Problems: ?  Dental abscess ?  AKI (acute kidney injury) ([place]) ?  GERD (gastroesophageal reflux disease) ?  Multiple sclerosis ([place]) ?  Depression ?  ?Assessment and Plan: ?Facial cellulitis ?Odontogenic abscess ?-CT scan finding as above showing an  abscess.  Her clinical symptoms is disproportionately severe than the scan finding.   ?-She needs inpatient stay on IV antibiotics and may even need surgical intervention.   ?-As discussed with oral surgeon this morning, I switch antibiotic to IV Unasyn.   ?-Warm compresses to continue for trismus. ? ?AKI (acute kidney injury)  ?-Likely due to poor oral intake secondary to trismus.  ?-Currently on IV saline at 125 mill per hour.  Continue the same.  ?Recent Labs  ?  03/10/21 ?1333 05/23/21 ?1746 07/30/21 ?1637 07/31/21 ?0324  ?BUN 15 19 27* 28*  ?CREATININE 0.84 0.92 1.68* 1.40*  ? ?GERD (gastroesophageal reflux disease) ?-Continue PPI ? ?Depression ?-We will continue Lexapro and trazodone. ? ?Multiple sclerosis ?-She is apparently in remission. ?-Patient states that she was recently started on gabapentin but she is feeling side effects of them and wants to stop them.  She is agreeable to try Lyrica. ? ?Goals of care ?  Code Status: Full Code  ? ? ?Mobility: Encourage ambulation ? ?Nutritional status:  ?There is no height or weight on file to calculate BMI.  ?  ?  ? ? ? ? ?Diet:  ?Diet Order   ? ?       ?  Diet clear liquid Room service appropriate? Yes; Fluid consistency: Thin  Diet effective now       ?  ? ?  ?  ? ?  ? ? ?DVT prophylaxis:  ?enoxaparin (LOVENOX) injection 40 mg Start: 07/31/21 1000 ?  ?Antimicrobials: IV Unasyn ?Fluid: NS at 125 mill per hour ?Consultants: Oral surgeon ?Family Communication: None at bedside ? ?Status  is: Observation ? ?Continue in-hospital care because: Needs IV antibiotics, may need surgical intervention ?Level of care: Med-Surg  ? ?Dispo: The patient is from: Home ?             Anticipated d/c is to: Pending clinical course ?             Patient currently is not medically stable to d/c. ?  Difficult to place patient No ? ? ? ? ?Infusions:  ? sodium chloride 125 mL/hr at 07/31/21 1019  ? ampicillin-sulbactam (UNASYN) IV    ? ? ?Scheduled Meds: ? cholecalciferol  1,000 Units Oral  Daily  ? enoxaparin (LOVENOX) injection  40 mg Subcutaneous Q24H  ? escitalopram  10 mg Oral QHS  ? multivitamin with minerals  1 tablet Oral Daily  ? pantoprazole  40 mg Oral Daily  ? pregabalin  50 mg Oral BID  ? ? ?PRN meds: ?acetaminophen **OR** acetaminophen, aspirin EC, magnesium hydroxide, ondansetron **OR** ondansetron (ZOFRAN) IV, traZODone  ? ?Antimicrobials: ?Anti-infectives (From admission, onward)  ? ? Start     Dose/Rate Route Frequency Ordered Stop  ? 07/31/21 1100  Ampicillin-Sulbactam (UNASYN) 3 g in sodium chloride 0.9 % 100 mL IVPB       ? 3 g ?200 mL/hr over 30 Minutes Intravenous Every 6 hours 07/31/21 1048    ? 07/30/21 2200  clindamycin (CLEOCIN) IVPB 600 mg  Status:  Discontinued       ? 600 mg ?100 mL/hr over 30 Minutes Intravenous Every 8 hours 07/30/21 2140 07/31/21 1024  ? 07/30/21 1930  clindamycin (CLEOCIN) IVPB 600 mg       ? 600 mg ?100 mL/hr over 30 Minutes Intravenous  Once 07/30/21 1916 07/30/21 2036  ? ?  ? ? ?Objective: ?Vitals:  ? 07/31/21 0838 07/31/21 0900  ?BP:  (!) 129/48  ?Pulse: 91 91  ?Resp:  18  ?Temp:    ?SpO2: 100% 99%  ? ? ?Intake/Output Summary (Last 24 hours) at 07/31/2021 1111 ?Last data filed at 07/31/2021 0256 ?Gross per 24 hour  ?Intake 50 ml  ?Output --  ?Net 50 ml  ? ?There were no vitals filed for this visit. ?Weight change:  ?There is no height or weight on file to calculate BMI.  ? ?Physical Exam: ?General exam: Pleasant, elderly African-American female.  Distress because of Christmas ?Skin: No rashes, lesions or ulcers. ?HEENT: Atraumatic, normocephalic, no obvious bleeding.  Swollen left mandibular area with significant trismus ?Lungs: Clear to auscultation bilaterally ?CVS: Regular rate and rhythm, no murmur ?GI/Abd soft, nontender, nondistended, bowel sound present ?CNS: Alert, awake, oriented x3 ?Psychiatry: Mood appropriate ?Extremities: No pedal edema, no calf tenderness ? ?Data Review: I have personally reviewed the laboratory data and studies  available. ? ?F/u labs ordered ?Unresulted Labs (From admission, onward)  ? ?  Start     Ordered  ? Unscheduled  CBC with Differential/Platelet  Daily,   R     ? 07/31/21 1111  ? Unscheduled  Basic metabolic panel  Daily,   R     ? 07/31/21 1111  ? ?  ?  ? ?  ? ? ?Signed, ?Terrilee Croak, MD ?[facility] ?07/31/2021 ? ? ? ? ? ? ? ? ? ? ?  ?

## 2021-07-31 NOTE — H&P (Signed)
H&P Infection ? ?Exam Date: 07/31/21 ? ?ID: The patient is a 53 yoF who presented with pain and swelling of the left face . ? ?History of Present Illness:  The patient reports having pain and swelling that began about 4 days ago.  The patient was referred by ED for evaluation.  The patient reports pain and difficulty opening her mouth, denies dyspnea or dysphagia.  The patient was started on Unsayn today and does not report significant benefit as of yet. ? ?  ?Clinical Exam: ?Extraoral Exam:  ?            Patient is alert, orientated and in mild distress ?            CN II-XII grossly intact ?            There is appreciable facial swelling on the left side of the face.  ?            The swelling extends into the periorbital region  ?  ?Intraoral Exam:  ?            The patient does have trismus with maximum incisal opening of 5 mm. ?            The buccal vestibule is raised. ?            The floor of mouth and oropharynx cannot be assessed due to trismus ?            The tongue is not elevated. ?            The cheek is indurated and firm ?  ?            Cardiovascular:  Regular rate and rhythm without any appreciable murmurs, gallops or rubs.   ?            Respiratory: Lungs were clear to auscultation bilaterally without any wheezing or rhonchi. ?            Abdomen: Non-distended  ?  ?Radiographic Exam:   ?            Panorex shows carious #18, with periapical radiolucencies associated with #18. ?             ?            A CT of the larynx with contrast was obtained showing fluid collections/cellulitis associated with the right buccal/masticator spaces. There is no airway deviation.  ?  ?Assessment: 32 yoF patient with a left buccal/masticator space infection.  ?  ?Plan:  The patient will require incision and drainage of the left buccal/masticator space infection with extraction of teeth as necessary, including #18, in the OR.  Consent was obtained and will be scanned into EPIC. ?  ? ?Tentative plan for OR  tomorrow/Friday @ 2:30 PM ?--Please make NPO 8 hour prior to OR; anticipate d/c on Saturday  ?--Continue IV Unasyn ? ?Risks, complications and alternatives of tooth extraction and incision and drainage procedures were discussed and questions were answered.  Among all potential risks and complications, I emphasized the potential for pain, bleeding, swelling, infection, localized alveolar osteitis (dry socket), temporary and permanent lingual and inferior alveolar nerve injury, oroantral (sinus) communication, oronasal communication, jaw fracture, damage to adjacent teeth and tissue, joint discomfort, bone/tooth fragments, recurrence of infection, need for additional procedures, facial nerve injury, scarring, limited mouth opening, drain placement, aspiration and anesthetic mishap. ?  ?Terese Door, DDS  ? ? ? ?

## 2021-08-01 ENCOUNTER — Inpatient Hospital Stay (HOSPITAL_COMMUNITY): Payer: Medicare HMO | Admitting: Anesthesiology

## 2021-08-01 ENCOUNTER — Encounter (HOSPITAL_COMMUNITY)
Admission: EM | Disposition: A | Discharge status: Home / Self Care | Payer: Self-pay | Source: Home / Self Care | Attending: Internal Medicine

## 2021-08-01 ENCOUNTER — Encounter (HOSPITAL_COMMUNITY): Payer: Self-pay | Admitting: Family Medicine

## 2021-08-01 DIAGNOSIS — F32A Depression, unspecified: Secondary | ICD-10-CM | POA: Diagnosis present

## 2021-08-01 DIAGNOSIS — Z9049 Acquired absence of other specified parts of digestive tract: Secondary | ICD-10-CM | POA: Diagnosis not present

## 2021-08-01 DIAGNOSIS — L03211 Cellulitis of face: Secondary | ICD-10-CM

## 2021-08-01 DIAGNOSIS — R252 Cramp and spasm: Secondary | ICD-10-CM | POA: Diagnosis present

## 2021-08-01 DIAGNOSIS — Z7982 Long term (current) use of aspirin: Secondary | ICD-10-CM | POA: Diagnosis not present

## 2021-08-01 DIAGNOSIS — G35 Multiple sclerosis: Secondary | ICD-10-CM | POA: Diagnosis present

## 2021-08-01 DIAGNOSIS — I10 Essential (primary) hypertension: Secondary | ICD-10-CM | POA: Diagnosis present

## 2021-08-01 DIAGNOSIS — Z6834 Body mass index (BMI) 34.0-34.9, adult: Secondary | ICD-10-CM | POA: Diagnosis not present

## 2021-08-01 DIAGNOSIS — Z87891 Personal history of nicotine dependence: Secondary | ICD-10-CM | POA: Diagnosis not present

## 2021-08-01 DIAGNOSIS — K219 Gastro-esophageal reflux disease without esophagitis: Secondary | ICD-10-CM | POA: Diagnosis present

## 2021-08-01 DIAGNOSIS — Z79899 Other long term (current) drug therapy: Secondary | ICD-10-CM | POA: Diagnosis not present

## 2021-08-01 DIAGNOSIS — K047 Periapical abscess without sinus: Secondary | ICD-10-CM | POA: Diagnosis present

## 2021-08-01 DIAGNOSIS — J45909 Unspecified asthma, uncomplicated: Secondary | ICD-10-CM | POA: Diagnosis present

## 2021-08-01 DIAGNOSIS — Z8249 Family history of ischemic heart disease and other diseases of the circulatory system: Secondary | ICD-10-CM | POA: Diagnosis not present

## 2021-08-01 DIAGNOSIS — Z8261 Family history of arthritis: Secondary | ICD-10-CM | POA: Diagnosis not present

## 2021-08-01 DIAGNOSIS — M069 Rheumatoid arthritis, unspecified: Secondary | ICD-10-CM | POA: Diagnosis present

## 2021-08-01 DIAGNOSIS — N179 Acute kidney failure, unspecified: Secondary | ICD-10-CM | POA: Diagnosis present

## 2021-08-01 DIAGNOSIS — R630 Anorexia: Secondary | ICD-10-CM | POA: Diagnosis present

## 2021-08-01 DIAGNOSIS — Z20822 Contact with and (suspected) exposure to covid-19: Secondary | ICD-10-CM | POA: Diagnosis present

## 2021-08-01 DIAGNOSIS — Z818 Family history of other mental and behavioral disorders: Secondary | ICD-10-CM | POA: Diagnosis not present

## 2021-08-01 HISTORY — PX: TOOTH EXTRACTION: SHX859

## 2021-08-01 HISTORY — PX: MASS EXCISION: SHX2000

## 2021-08-01 LAB — BASIC METABOLIC PANEL
Anion gap: 7 (ref 5–15)
BUN: 14 mg/dL (ref 8–23)
CO2: 22 mmol/L (ref 22–32)
Calcium: 8.6 mg/dL — ABNORMAL LOW (ref 8.9–10.3)
Chloride: 113 mmol/L — ABNORMAL HIGH (ref 98–111)
Creatinine, Ser: 0.94 mg/dL (ref 0.44–1.00)
GFR, Estimated: >60 mL/min (ref >60)
Glucose, Bld: 89 mg/dL (ref 70–99)
Potassium: 3.5 mmol/L (ref 3.5–5.1)
Sodium: 142 mmol/L (ref 135–145)

## 2021-08-01 LAB — CBC WITH DIFFERENTIAL/PLATELET
Abs Immature Granulocytes: 0.01 10*3/uL (ref 0.00–0.07)
Basophils Absolute: 0.1 10*3/uL (ref 0.0–0.1)
Basophils Relative: 1 %
Eosinophils Absolute: 0.2 10*3/uL (ref 0.0–0.5)
Eosinophils Relative: 3 %
HCT: 36.4 % (ref 36.0–46.0)
Hemoglobin: 11.6 g/dL — ABNORMAL LOW (ref 12.0–15.0)
Immature Granulocytes: 0 %
Lymphocytes Relative: 30 %
Lymphs Abs: 1.8 10*3/uL (ref 0.7–4.0)
MCH: 27.6 pg (ref 26.0–34.0)
MCHC: 31.9 g/dL (ref 30.0–36.0)
MCV: 86.5 fL (ref 80.0–100.0)
Monocytes Absolute: 1 10*3/uL (ref 0.1–1.0)
Monocytes Relative: 16 %
Neutro Abs: 3.2 10*3/uL (ref 1.7–7.7)
Neutrophils Relative %: 50 %
Platelets: 208 10*3/uL (ref 150–400)
RBC: 4.21 MIL/uL (ref 3.87–5.11)
RDW: 13.2 % (ref 11.5–15.5)
WBC: 6.2 10*3/uL (ref 4.0–10.5)
nRBC: 0 % (ref 0.0–0.2)

## 2021-08-01 LAB — SURGICAL PCR SCREEN
MRSA, PCR: NEGATIVE
Staphylococcus aureus: NEGATIVE

## 2021-08-01 SURGERY — EXCISION MASS
Anesthesia: General

## 2021-08-01 MED ORDER — MIDAZOLAM HCL 5 MG/5ML IJ SOLN
INTRAMUSCULAR | Status: DC | PRN
  Administered 2021-08-01: 1 mg via INTRAVENOUS

## 2021-08-01 MED ORDER — ACETAMINOPHEN 10 MG/ML IV SOLN: 1000.0000 mg | Freq: Once | INTRAVENOUS | Status: DC | PRN

## 2021-08-01 MED ORDER — LIDOCAINE-EPINEPHRINE 2 %-1:100000 IJ SOLN
INTRAMUSCULAR | Status: DC | PRN
  Administered 2021-08-01: 4 mL via INTRADERMAL

## 2021-08-01 MED ORDER — DEXAMETHASONE SODIUM PHOSPHATE 10 MG/ML IJ SOLN
INTRAMUSCULAR | Status: AC
  Filled 2021-08-01: qty 1

## 2021-08-01 MED ORDER — FENTANYL CITRATE (PF) 250 MCG/5ML IJ SOLN
INTRAMUSCULAR | Status: AC
  Filled 2021-08-01: qty 5

## 2021-08-01 MED ORDER — ORAL CARE MOUTH RINSE: 15.0000 mL | Freq: Once | OROMUCOSAL | Status: AC

## 2021-08-01 MED ORDER — ACETAMINOPHEN 10 MG/ML IV SOLN
INTRAVENOUS | Status: AC
  Filled 2021-08-01: qty 100

## 2021-08-01 MED ORDER — CHLORHEXIDINE GLUCONATE 0.12 % MT SOLN
OROMUCOSAL | Status: AC
  Administered 2021-08-01: 15 mL via OROMUCOSAL
  Filled 2021-08-01: qty 15

## 2021-08-01 MED ORDER — MIDAZOLAM HCL 2 MG/2ML IJ SOLN
INTRAMUSCULAR | Status: AC
  Filled 2021-08-01: qty 2

## 2021-08-01 MED ORDER — LIDOCAINE-EPINEPHRINE 2 %-1:100000 IJ SOLN
INTRAMUSCULAR | Status: AC
  Filled 2021-08-01: qty 1

## 2021-08-01 MED ORDER — LABETALOL HCL 5 MG/ML IV SOLN
10.0000 mg | Freq: Once | INTRAVENOUS | Status: AC
  Administered 2021-08-01: 10 mg via INTRAVENOUS
  Filled 2021-08-01: qty 4

## 2021-08-01 MED ORDER — CHLORHEXIDINE GLUCONATE 0.12 % MT SOLN: 15.0000 mL | Freq: Once | OROMUCOSAL | Status: AC

## 2021-08-01 MED ORDER — SUCCINYLCHOLINE CHLORIDE 200 MG/10ML IV SOSY
PREFILLED_SYRINGE | INTRAVENOUS | Status: AC
  Filled 2021-08-01: qty 10

## 2021-08-01 MED ORDER — PROPOFOL 10 MG/ML IV BOLUS
INTRAVENOUS | Status: AC
  Filled 2021-08-01: qty 20

## 2021-08-01 MED ORDER — FENTANYL CITRATE (PF) 100 MCG/2ML IJ SOLN
INTRAMUSCULAR | Status: DC | PRN
  Administered 2021-08-01 (×2): 100 ug via INTRAVENOUS

## 2021-08-01 MED ORDER — OXYCODONE HCL 5 MG/5ML PO SOLN: 5.0000 mg | Freq: Once | ORAL | Status: DC | PRN

## 2021-08-01 MED ORDER — LABETALOL HCL 5 MG/ML IV SOLN
INTRAVENOUS | Status: AC
  Filled 2021-08-01: qty 4

## 2021-08-01 MED ORDER — LABETALOL HCL 5 MG/ML IV SOLN
10.0000 mg | Freq: Once | INTRAVENOUS | Status: AC
  Administered 2021-08-01: 10 mg via INTRAVENOUS

## 2021-08-01 MED ORDER — ACETAMINOPHEN 500 MG PO TABS: 1000.0000 mg | ORAL_TABLET | Freq: Once | ORAL | Status: DC | PRN

## 2021-08-01 MED ORDER — ONDANSETRON HCL 4 MG/2ML IJ SOLN
INTRAMUSCULAR | Status: AC
  Filled 2021-08-01: qty 2

## 2021-08-01 MED ORDER — OXYCODONE HCL 5 MG PO TABS: 5.0000 mg | ORAL_TABLET | Freq: Once | ORAL | Status: DC | PRN

## 2021-08-01 MED ORDER — ACETAMINOPHEN 160 MG/5ML PO SOLN: 1000.0000 mg | Freq: Once | ORAL | Status: DC | PRN

## 2021-08-01 MED ORDER — OXYMETAZOLINE HCL 0.05 % NA SOLN
NASAL | Status: AC
  Filled 2021-08-01: qty 30

## 2021-08-01 MED ORDER — LIDOCAINE HCL (CARDIAC) PF 100 MG/5ML IV SOSY
PREFILLED_SYRINGE | INTRAVENOUS | Status: DC | PRN
  Administered 2021-08-01: 50 mg via INTRAVENOUS

## 2021-08-01 MED ORDER — SUCCINYLCHOLINE CHLORIDE 200 MG/10ML IV SOSY
PREFILLED_SYRINGE | INTRAVENOUS | Status: DC | PRN
  Administered 2021-08-01: 160 mg via INTRAVENOUS

## 2021-08-01 MED ORDER — PROPOFOL 10 MG/ML IV BOLUS
INTRAVENOUS | Status: DC | PRN
  Administered 2021-08-01: 100 mg via INTRAVENOUS
  Administered 2021-08-01: 60 mg via INTRAVENOUS
  Administered 2021-08-01: 40 mg via INTRAVENOUS

## 2021-08-01 MED ORDER — FENTANYL CITRATE (PF) 100 MCG/2ML IJ SOLN
25.0000 ug | INTRAMUSCULAR | Status: DC | PRN
  Administered 2021-08-01: 50 ug via INTRAVENOUS
  Administered 2021-08-01 (×2): 25 ug via INTRAVENOUS

## 2021-08-01 MED ORDER — HYDRALAZINE HCL 20 MG/ML IJ SOLN
10.0000 mg | Freq: Four times a day (QID) | INTRAMUSCULAR | Status: DC | PRN
  Administered 2021-08-01: 10 mg via INTRAVENOUS
  Filled 2021-08-01: qty 1

## 2021-08-01 MED ORDER — 0.9 % SODIUM CHLORIDE (POUR BTL) OPTIME
TOPICAL | Status: DC | PRN
  Administered 2021-08-01: 200 mL

## 2021-08-01 MED ORDER — DEXAMETHASONE SODIUM PHOSPHATE 10 MG/ML IJ SOLN
INTRAMUSCULAR | Status: DC | PRN
  Administered 2021-08-01: 10 mg via INTRAVENOUS

## 2021-08-01 MED ORDER — LACTATED RINGERS IV SOLN: INTRAVENOUS | Status: DC

## 2021-08-01 MED ORDER — ACETAMINOPHEN 10 MG/ML IV SOLN
INTRAVENOUS | Status: DC | PRN
  Administered 2021-08-01: 1000 mg via INTRAVENOUS

## 2021-08-01 MED ORDER — FENTANYL CITRATE (PF) 100 MCG/2ML IJ SOLN
INTRAMUSCULAR | Status: AC
  Filled 2021-08-01: qty 2

## 2021-08-01 SURGICAL SUPPLY — 27 items
BLADE SURG 15 STRL LF DISP TIS (BLADE) ×1 IMPLANT
BLADE SURG 15 STRL SS (BLADE) ×3
CANISTER SUCT 3000ML PPV (MISCELLANEOUS) ×3 IMPLANT
COVER SURGICAL LIGHT HANDLE (MISCELLANEOUS) ×3 IMPLANT
DRAIN PENROSE 0.25X18 (DRAIN) ×2 IMPLANT
DRAPE U-SHAPE 76X120 STRL (DRAPES) ×3 IMPLANT
GAUZE PACKING FOLDED 2  STR (GAUZE/BANDAGES/DRESSINGS) ×3
GAUZE PACKING FOLDED 2 STR (GAUZE/BANDAGES/DRESSINGS) ×1 IMPLANT
GAUZE SPONGE 4X4 12PLY STRL (GAUZE/BANDAGES/DRESSINGS) ×2 IMPLANT
GAUZE SPONGE 4X4 12PLY STRL LF (GAUZE/BANDAGES/DRESSINGS) ×3 IMPLANT
GLOVE SURG ENC MOIS LTX SZ8 (GLOVE) ×3 IMPLANT
GOWN STRL REUS W/ TWL LRG LVL3 (GOWN DISPOSABLE) ×1 IMPLANT
GOWN STRL REUS W/ TWL XL LVL3 (GOWN DISPOSABLE) ×1 IMPLANT
GOWN STRL REUS W/TWL LRG LVL3 (GOWN DISPOSABLE) ×3
GOWN STRL REUS W/TWL XL LVL3 (GOWN DISPOSABLE) ×3
KIT BASIN OR (CUSTOM PROCEDURE TRAY) ×3 IMPLANT
KIT TURNOVER KIT B (KITS) ×3 IMPLANT
NDL HYPO 25GX1X1/2 BEV (NEEDLE) ×2 IMPLANT
NEEDLE HYPO 25GX1X1/2 BEV (NEEDLE) ×6 IMPLANT
NS IRRIG 1000ML POUR BTL (IV SOLUTION) ×3 IMPLANT
PAD ARMBOARD 7.5X6 YLW CONV (MISCELLANEOUS) ×3 IMPLANT
SUT CHROMIC 3 0 PS 2 (SUTURE) ×3 IMPLANT
SYR BULB IRRIG 60ML STRL (SYRINGE) ×3 IMPLANT
SYR CONTROL 10ML LL (SYRINGE) ×3 IMPLANT
TRAY ENT MC OR (CUSTOM PROCEDURE TRAY) ×3 IMPLANT
TUBE SALEM SUMP 16 FR W/ARV (TUBING) ×3 IMPLANT
YANKAUER SUCT BULB TIP NO VENT (SUCTIONS) ×3 IMPLANT

## 2021-08-01 NOTE — Op Note (Addendum)
PRE-OPERATIVE DIAGNOSIS:  Odontogenic infection ?POST-OPERATIVE DIAGNOSIS:  Same  ?  ?SURGEON:  Surgeon(s) and Role: ?   Conley Simmonds, Gareth Morgan, DMD - Primary ?  ?Procedure: ?Surgical removal #18 ([id number]) ?I&D of masticator space abscess ([id number]) ?  ?Description of Operation/Procedure: ?  ?The patient was encountered in [facility] OR Room 7.  General anesthesia was induced and an oral ETT was secured in the standard fashion.  The table was moved slightly away from anesthesia and the patient was properly padded, relieving all pressure points.  A formal time-out was executed.  2% lidocaine with 1:100,000 epinephrine was infiltrated into the proposed surgical sites.  The patient was prepped and draped in the standard sterile fashion and a throat pack was placed.              ?  ?Attention was then directed intraorally to the lower right.  A full thickness mucoperiosteal flap was elevated on the buccal.  Teeth #18 were then removed with standard elevator and forceps technique without complication. The extraction sites were thoroughly curetted, bone was smoothed, and the sites thoroughly irrigated. Dissection ensued into the submasseteric (masticator) and buccal spaces and moderate purulence was encountered which was sent for aerobic and anaerobic cultures and a stat Gram stain. Buccal soft tissues were decompressed and drain placement was deferred due to anticipated quick improvement. All areas were thoroughly irrigated.  The full thickness mucoperiosteal flap was reapproximated with 3-0 chromic gut suture.   ?  ?The oral cavity was suctioned free of all debris and secretions.  The teeth were brushed. The throat pack was removed. Sponge and needle counts were correct x 2.  Care of the patient was turned over to the Anesthesia team for uneventful extubation and delivery of the patient to the PACU in stable condition. ?  ?ANESTHESIA:   general ?EBL:  20 mL  ?DRAINS: none ?LOCAL MEDICATIONS USED:  LIDOCAINE 2% w/ 1:100000  epi ?SPECIMEN:  Aspirate - cultures for anaerobes, aerobes, and fungal ?PLAN OF CARE: Return to floor ?PATIENT DISPOSITION:  PACU - hemodynamically stable. ?Delay start of Pharmacological VTE agent (>24hrs) due to surgical blood loss or risk of bleeding: no ?  ?OMFS Recommendations ?-Soft mechanical diet okay ?-Peridex (chlorhexidine) mouthrnise TID ?-Continue Unasyn q6hr while inpatient ?-Follow cultures ?-Daily CBC w/ diff  ?-Okay from oral surgery standpoint to discharge today if patient desires but would anticipate patient would be more ready for discharge tomorrow 08/02/21; no drain or suture removal needed ?-Rx Augmentin BID x 7 days and peridex rinse TID x 7 days upon discharge ?-Follow up in clinic in 1 week ? ?Terese Door, DDS ?Oral and Maxillofacial Surgeon ?[facility] ([place]) ?Office # [phone] ?Cell # [phone] ? ?

## 2021-08-01 NOTE — Anesthesia Preprocedure Evaluation (Signed)
Anesthesia Evaluation  ?Patient identified by MRN, date of birth, ID band ?Patient awake ? ? ? ?Reviewed: ?Allergy & Precautions, NPO status , Patient's Chart, lab work & pertinent test results ? ?History of Anesthesia Complications ?Negative for: history of anesthetic complications ? ?Airway ?Mallampati: IV ? ?TM Distance: >3 FB ?Neck ROM: Full ? ?Mouth opening: Limited Mouth Opening ? Dental ? ?(+) Teeth Intact, Dental Advisory Given ?  ?Pulmonary ?neg shortness of breath, asthma , neg sleep apnea, neg COPD, neg recent URI, former smoker,  ?  ?breath sounds clear to auscultation ? ? ? ? ? ? Cardiovascular ?hypertension, (-) angina(-) Past MI and (-) CHF  ?Rhythm:Regular  ?Left ventricle: The cavity size was normal. Systolic function was  ???vigorous. The estimated ejection fraction was in the range of 65%  ???to 70%. Wall motion was normal; there were no regional wall  ???motion abnormalities. Doppler parameters are consistent with  ???abnormal left ventricular relaxation (grade 1 diastolic  ???dysfunction).  ?- Aortic valve: Trileaflet; moderately thickened, moderately  ???calcified leaflets.  ?  ?Neuro/Psych ?PSYCHIATRIC DISORDERS Depression negative neurological ROS ?   ? GI/Hepatic ?GERD  Medicated and Controlled,(+) Hepatitis -, CTreated ?  ?Endo/Other  ?negative endocrine ROS ? Renal/GU ?negative Renal ROSLab Results ?     Component                Value               Date                 ?     CREATININE               0.94                08/01/2021           ?Lab Results ?     Component                Value               Date                 ?     K                        3.5                 08/01/2021           ?  ? ?  ?Musculoskeletal ? ?(+) Arthritis ,  ? Abdominal ?  ?Peds ? Hematology ? ?(+) Blood dyscrasia, anemia , Lab Results ?     Component                Value               Date                 ?     WBC                      6.2                 08/01/2021            ?     HGB                      11.6 (L)  08/01/2021           ?     HCT                      36.4                08/01/2021           ?     MCV                      86.5                08/01/2021           ?     PLT                      208                 08/01/2021           ?   ?Anesthesia Other Findings ? ? Reproductive/Obstetrics ? ?  ? ? ? ? ? ? ? ? ? ? ? ? ? ?  ?  ? ? ? ? ? ? ? ? ?Anesthesia Physical ?Anesthesia Plan ? ?ASA: 2 ? ?Anesthesia Plan: General  ? ?Post-op Pain Management: Ofirmev IV (intra-op)*  ? ?Induction: Intravenous ? ?PONV Risk Score and Plan: 3 and Ondansetron, Dexamethasone and Treatment may vary due to age or medical condition ? ?Airway Management Planned: Nasal Cannula and Fiberoptic Intubation Planned ? ?Additional Equipment: None ? ?Intra-op Plan:  ? ?Post-operative Plan: Extubation in OR ? ?Informed Consent: I have reviewed the patients History and Physical, chart, labs and discussed the procedure including the risks, benefits and alternatives for the proposed anesthesia with the patient or authorized representative who has indicated his/her understanding and acceptance.  ? ? ? ?Dental advisory given ? ?Plan Discussed with: CRNA and Anesthesiologist ? ?Anesthesia Plan Comments:   ? ? ? ? ? ? ?Anesthesia Quick Evaluation ? ?

## 2021-08-01 NOTE — Anesthesia Procedure Notes (Signed)
Procedure Name: Intubation ?Date/Time: 08/01/2021 1:58 PM ?Performed by: Jonna Munro, CRNA ?Pre-anesthesia Checklist: Patient identified, Emergency Drugs available, Suction available, Patient being monitored and Timeout performed ?Patient Re-evaluated:Patient Re-evaluated prior to induction ?Oxygen Delivery Method: Circle system utilized ?Preoxygenation: Pre-oxygenation with 100% oxygen ?Induction Type: IV induction ?Ventilation: Mask ventilation without difficulty ?Laryngoscope Size: Glidescope, 3 and Mac ?Grade View: Grade I ?Tube type: Oral ?Tube size: 7.0 mm ?Number of attempts: 1 ?Airway Equipment and Method: Rigid stylet and Video-laryngoscopy ?Placement Confirmation: ETT inserted through vocal cords under direct vision, positive ETCO2 and breath sounds checked- equal and bilateral ?Secured at: 22 cm ?Tube secured with: Tape ?Dental Injury: Teeth and Oropharynx as per pre-operative assessment  ? ? ? ? ?

## 2021-08-01 NOTE — Transfer of Care (Signed)
Immediate Anesthesia Transfer of Care Note ? ?Patient: [name] ? ?Procedure(s) Performed: EXCISION AND DRAINAGE OF FACIAL ABCES ?DENTAL RESTORATION/EXTRACTIONS ? ?Patient Location: PACU ? ?Anesthesia Type:General ? ?Level of Consciousness: awake, alert , oriented and patient cooperative ? ?Airway & Oxygen Therapy: Patient Spontanous Breathing and Patient connected to face mask oxygen ? ?Post-op Assessment: Report given to RN, Post -op Vital signs reviewed and stable and Patient moving all extremities X 4 ? ?Post vital signs: Reviewed and stable ? ?Last Vitals:  ?Vitals Value Taken Time  ?BP 159/102 08/01/21 1443  ?Temp 36.1 ?C 08/01/21 1443  ?Pulse 94 08/01/21 1443  ?Resp 15 08/01/21 1444  ?SpO2 93 % 08/01/21 1443  ?Vitals shown include unvalidated device data. ? ?Last Pain:  ?Vitals:  ? 08/01/21 1313  ?TempSrc: Axillary  ?PainSc:   ?   ? ?Patients Stated Pain Goal: 3 (08/01/21 1258) ? ?Complications: No notable events documented. ?

## 2021-08-01 NOTE — Progress Notes (Signed)
?PROGRESS NOTE ? ?Deborah Reid  ?DOB: 07-28-1950  ?PCP: Cipriano Mile, NP ?ENI:[id number]  ?DOA: 07/30/2021 ? LOS: 0 days  ?Hospital Day: 3 ? ?Brief narrative: ?Deborah Reid is a 71 y.o. female with PMH significant for multiple sclerosis, rheumatoid arthritis, hepatitis C, HTN, GERD, asthma, depression. ?Patient presented to the ED on 3/8 with complaint of left facial swelling, pain, progressively worsening for the last couple weeks to the point where she now has significant limitation in opening her mouth. ?She was seen by a dentist on 3/2 and was referred to an oral surgeon but her symptoms progressed while waiting for the appointment. ? ?In the ED, patient was afebrile, and hemodynamically stable ?Labs with unremarkable CBC, BMP with creatinine elevated to 1.68 ?CT soft tissue neck with contrast showed an 1.5 x 0.8 x 1.5 cm odontogenic abscess positioned along the posterior left mandibular body. Associated soft tissue swelling and inflammatory stranding within the adjacent left masticator and submandibular spaces consistent with regional cellulitis. Periapical lucency noted about the root of the left second mandibular molar, suspected to be the source of infection. ? ?Patient was started on IV clindamycin and admitted to hospital service.  ? ?Subjective: ?Patient was seen and examined this morning.   ?Lying down in bed.  Not in distress.  Waiting for oral surgery this afternoon.   ? ?Principal Problem: ?  Facial cellulitis ?Active Problems: ?  Dental abscess ?  AKI (acute kidney injury) ([place]) ?  GERD (gastroesophageal reflux disease) ?  Multiple sclerosis ([place]) ?  Depression ?  ?Assessment and Plan: ?Facial cellulitis ?Odontogenic abscess ?-CT scan finding as above showing an abscess.  Her clinical symptoms is disproportionately severe than the scan finding.   ?-Currently on IV Unasyn.  Pending oral surgery this afternoon.  Pain controlled. ?-Continue warm compresses to continue for trismus. ? ?AKI (acute  kidney injury)  ?-Likely due to poor oral intake secondary to trismus.  ?-Currently on IV saline at 125 mill per hour.  Creatinine improved.  Can reduce to 50 mill per hour today.  Continue the same.  ?Recent Labs  ?  03/10/21 ?1333 05/23/21 ?1746 07/30/21 ?1637 07/31/21 ?0324 08/01/21 ?6144  ?BUN 15 19 27* 28* 14  ?CREATININE 0.84 0.92 1.68* 1.40* 0.94  ? ?GERD (gastroesophageal reflux disease) ?-Continue PPI ? ?Depression ?-We will continue Lexapro and trazodone. ? ?Multiple sclerosis ?-She is apparently in remission. ?-Patient states that she was recently started on gabapentin but she is feeling side effects of them and wants to stop them.  After discussion with patient, I started her on Lyrica. ? ?Goals of care ?  Code Status: Full Code  ? ? ?Mobility: Encourage ambulation ? ?Nutritional status:  ?Body mass index is 34.15 kg/m?.  ?  ?  ? ? ? ? ?Diet:  ?Diet Order   ? ?       ?  Diet NPO time specified  Diet effective midnight       ?  ? ?  ?  ? ?  ? ? ?DVT prophylaxis:  ?SCD's Start: 07/31/21 1713 ?enoxaparin (LOVENOX) injection 40 mg Start: 07/31/21 1000 ?  ?Antimicrobials: IV Unasyn ?Fluid: NS at 50 mill per hour ?Consultants: Oral surgeon ?Family Communication: None at bedside ? ?Status is: Observation ? ?Continue in-hospital care because: Needs IV antibiotics, may need surgical intervention ?Level of care: Med-Surg  ? ?Dispo: The patient is from: Home ?             Anticipated d/c is to:  Pending clinical course ?             Patient currently is not medically stable to d/c. ?  Difficult to place patient No ? ? ? ? ?Infusions:  ? sodium chloride 50 mL/hr at 08/01/21 1219  ? [MAR Hold] ampicillin-sulbactam (UNASYN) IV 3 g (08/01/21 1026)  ? ampicillin-sulbactam (UNASYN) IV    ? lactated ringers 10 mL/hr at 08/01/21 1252  ? ? ?Scheduled Meds: ? [MAR Hold] cholecalciferol  1,000 Units Oral Daily  ? [MAR Hold] enoxaparin (LOVENOX) injection  40 mg Subcutaneous Q24H  ? [MAR Hold] escitalopram  10 mg Oral QHS  ?  [MAR Hold] multivitamin with minerals  1 tablet Oral Daily  ? [MAR Hold] pantoprazole  40 mg Oral Daily  ? [MAR Hold] pregabalin  50 mg Oral BID  ? ? ?PRN meds: ?[MAR Hold] acetaminophen **OR** [MAR Hold] acetaminophen, [MAR Hold] aspirin EC, [MAR Hold] magnesium hydroxide, [MAR Hold] ondansetron **OR** [MAR Hold] ondansetron (ZOFRAN) IV, [MAR Hold] traZODone  ? ?Antimicrobials: ?Anti-infectives (From admission, onward)  ? ? Start     Dose/Rate Route Frequency Ordered Stop  ? 08/01/21 0600  Ampicillin-Sulbactam (UNASYN) 3 g in sodium chloride 0.9 % 100 mL IVPB       ? 3 g ?200 mL/hr over 30 Minutes Intravenous On call to O.R. 07/31/21 1659 08/02/21 0559  ? 07/31/21 1100  [MAR Hold]  Ampicillin-Sulbactam (UNASYN) 3 g in sodium chloride 0.9 % 100 mL IVPB        (MAR Hold since Fri 08/01/2021 at 1241.Hold Reason: Transfer to a Procedural area)  ? 3 g ?200 mL/hr over 30 Minutes Intravenous Every 6 hours 07/31/21 1048    ? 07/30/21 2200  clindamycin (CLEOCIN) IVPB 600 mg  Status:  Discontinued       ? 600 mg ?100 mL/hr over 30 Minutes Intravenous Every 8 hours 07/30/21 2140 07/31/21 1024  ? 07/30/21 1930  clindamycin (CLEOCIN) IVPB 600 mg       ? 600 mg ?100 mL/hr over 30 Minutes Intravenous  Once 07/30/21 1916 07/30/21 2036  ? ?  ? ? ?Objective: ?Vitals:  ? 08/01/21 0751 08/01/21 1313  ?BP: (!) 168/68 (!) 174/58  ?Pulse: 83 83  ?Resp: 18 20  ?Temp: (!) 97.5 ?F (36.4 ?C) 98.2 ?F (36.8 ?C)  ?SpO2: 100% 97%  ? ? ?Intake/Output Summary (Last 24 hours) at 08/01/2021 1355 ?Last data filed at 08/01/2021 0122 ?Gross per 24 hour  ?Intake 1620 ml  ?Output --  ?Net 1620 ml  ? ?Filed Weights  ? 07/31/21 1852  ?Weight: 84.7 kg  ? ?Weight change:  ?Body mass index is 34.15 kg/m?.  ? ?Physical Exam: ?General exam: Pleasant, elderly African-American female.  Distress because of Christmas ?Skin: No rashes, lesions or ulcers. ?HEENT: Atraumatic, normocephalic, no obvious bleeding.  Continues to have swollen left mandibular area with  significant trismus ?Lungs: Clear to auscultation bilaterally ?CVS: Regular rate and rhythm, no murmur ?GI/Abd soft, nontender, nondistended, bowel sound present ?CNS: Alert, awake, oriented x3 ?Psychiatry: Mood appropriate ?Extremities: No pedal edema, no calf tenderness ? ?Data Review: I have personally reviewed the laboratory data and studies available. ? ?F/u labs ordered ?Unresulted Labs (From admission, onward)  ? ?  Start     Ordered  ? 08/01/21 0500  CBC with Differential/Platelet  Daily,   R     ? 07/31/21 1111  ? 08/01/21 7062  Basic metabolic panel  Daily,   R     ? 07/31/21 1111  ? ?  ?  ? ?  ? ? ?  Signed, ?Terrilee Croak, MD ?[facility] ?08/01/2021 ? ? ? ? ? ? ? ? ? ? ?  ?

## 2021-08-01 NOTE — Progress Notes (Signed)
Progress Note - Infection ? ?ID: The patient is a 45 yoF who presented with pain and swelling of the left face . ? ?History of Present Illness:  The patient reports having pain and swelling that began about 4 days ago.  The patient was referred by ED for evaluation.  The patient reports pain and difficulty opening her mouth, denies dyspnea or dysphagia.  The patient was started on Unsayn today and does not report significant benefit as of yet. ? ?Interval History ?3/10 - Patient is NPO awaiting OR today for I&D and extraction of tooth #18. Reports no changes since yesterday, MIO still about 70m. WBC 6. ? ?  ?Clinical Exam: ?Extraoral Exam:  ?            Patient is alert, orientated and in mild distress ?            CN II-XII grossly intact ?            There is appreciable facial swelling on the left side of the face.  ?            The swelling extends into the periorbital region  ?  ?Intraoral Exam:  ?            The patient does have trismus with maximum incisal opening of 5 mm. ?            The buccal vestibule is raised. ?            The floor of mouth and oropharynx cannot be assessed due to trismus ?            The tongue is not elevated. ?            The cheek is indurated and firm ?  ?            Cardiovascular:  Regular rate and rhythm without any appreciable murmurs, gallops or rubs.   ?            Respiratory: Lungs were clear to auscultation bilaterally without any wheezing or rhonchi. ?            Abdomen: Non-distended  ?  ?Radiographic Exam:   ?            Panorex shows carious #18, with periapical radiolucencies associated with #18. ?            A CT of the larynx with contrast was obtained showing fluid collections/cellulitis associated with the right buccal/masticator spaces. There is no airway deviation.  ? ?  ?Assessment: 89yoF patient with a left buccal/masticator space infection.  ?  ?Plan:  The patient will require incision and drainage of the left buccal/masticator space infection with  extraction of teeth as necessary, including #18, in the OR.  Consent was obtained and will be scanned into EPIC. ?  ?Plan for OR today @ 2:30 PM ?--Please make NPO 8 hour prior to OR; anticipate d/c on Saturday  ?--Continue IV Unasyn ? ? ? ?CTerese Door DDS ?Oral and Maxillofacial Surgeon ?[facility] ?Office # [phone]?Cell # [phone]? ? ? ? ?

## 2021-08-02 LAB — CBC WITH DIFFERENTIAL/PLATELET
Abs Immature Granulocytes: 0.03 10*3/uL (ref 0.00–0.07)
Basophils Absolute: 0 10*3/uL (ref 0.0–0.1)
Basophils Relative: 0 %
Eosinophils Absolute: 0 10*3/uL (ref 0.0–0.5)
Eosinophils Relative: 0 %
HCT: 35.9 % — ABNORMAL LOW (ref 36.0–46.0)
Hemoglobin: 11.6 g/dL — ABNORMAL LOW (ref 12.0–15.0)
Immature Granulocytes: 0 %
Lymphocytes Relative: 11 %
Lymphs Abs: 1 10*3/uL (ref 0.7–4.0)
MCH: 27.4 pg (ref 26.0–34.0)
MCHC: 32.3 g/dL (ref 30.0–36.0)
MCV: 84.9 fL (ref 80.0–100.0)
Monocytes Absolute: 0.2 10*3/uL (ref 0.1–1.0)
Monocytes Relative: 2 %
Neutro Abs: 7.6 10*3/uL (ref 1.7–7.7)
Neutrophils Relative %: 87 %
Platelets: 235 10*3/uL (ref 150–400)
RBC: 4.23 MIL/uL (ref 3.87–5.11)
RDW: 13.2 % (ref 11.5–15.5)
WBC: 8.8 10*3/uL (ref 4.0–10.5)
nRBC: 0 % (ref 0.0–0.2)

## 2021-08-02 LAB — BASIC METABOLIC PANEL
Anion gap: 8 (ref 5–15)
BUN: 10 mg/dL (ref 8–23)
CO2: 22 mmol/L (ref 22–32)
Calcium: 8.5 mg/dL — ABNORMAL LOW (ref 8.9–10.3)
Chloride: 107 mmol/L (ref 98–111)
Creatinine, Ser: 0.91 mg/dL (ref 0.44–1.00)
GFR, Estimated: >60 mL/min (ref >60)
Glucose, Bld: 197 mg/dL — ABNORMAL HIGH (ref 70–99)
Potassium: 3.6 mmol/L (ref 3.5–5.1)
Sodium: 137 mmol/L (ref 135–145)

## 2021-08-02 MED ORDER — OXYCODONE-ACETAMINOPHEN 5-325 MG PO TABS: 1.0000 | ORAL_TABLET | ORAL | Status: DC | PRN

## 2021-08-02 NOTE — Progress Notes (Addendum)
?PROGRESS NOTE ? ?[name]  ?DOB: 21-Oct-1950  ?PCP: Cipriano Mile, NP ?DXI:[id number]  ?DOA: 07/30/2021 ? LOS: 1 day  ?Hospital Day: 4 ? ?Brief narrative: ?Deborah Reid is a 70 y.o. female with PMH significant for multiple sclerosis, rheumatoid arthritis, hepatitis C, HTN, GERD, asthma, depression. ?Patient presented to the ED on 3/8 with complaint of left facial swelling, pain, progressively worsening for the last couple weeks to the point where she now has significant limitation in opening her mouth. ?She was seen by a dentist on 3/2 and was referred to an oral surgeon but her symptoms progressed while waiting for the appointment. ? ?In the ED, patient was afebrile, and hemodynamically stable ?Labs with unremarkable CBC, BMP with creatinine elevated to 1.68 ?CT soft tissue neck with contrast showed an 1.5 x 0.8 x 1.5 cm odontogenic abscess positioned along the posterior left mandibular body. Associated soft tissue swelling and inflammatory stranding within the adjacent left masticator and submandibular spaces consistent with regional cellulitis. Periapical lucency noted about the root of the left second mandibular molar, suspected to be the source of infection. ? ?Patient was started on IV clindamycin and admitted to hospital service.  ? ?Subjective: ?Patient was seen and examined this morning.   ?Lying down on bed. Underwent I&D and extraction of tooth yesterday.  ?Continues to have pain.  Does not feel comfortable going home today. ? ?Principal Problem: ?  Facial cellulitis ?Active Problems: ?  Dental abscess ?  AKI (acute kidney injury) ([place]) ?  GERD (gastroesophageal reflux disease) ?  Multiple sclerosis ([place]) ?  Depression ?  ?Assessment and Plan: ?Facial cellulitis ?Odontogenic abscess ?s/p I&D and extraction of tooth ?-CT scan finding as above showing an abscess.  Her clinical symptoms is disproportionately severe than the scan finding.   ?-Co. surgery consult appreciated. ?-3/10, patient underwent  surgical removal of tooth #18, and I&D of masticator space abscess. ?-Currently on IV Unasyn. Continue warm compresses to continue for trismus. ?-Per our note, she does not have a drain or suture to be removed.  At discharge, we will switch her to Augmentin twice daily X 7 days and Peridex rinse 3 times daily for 7 days.  To follow-up in clinic in 1 week. ?-Because of the degree of pain and trismus, patient does not feel comfortable going home today. ? ?AKI (acute kidney injury)  ?-Likely due to poor oral intake secondary to trismus.  ?-Improved with IV fluid. ?Recent Labs  ?  03/10/21 ?1333 05/23/21 ?1746 07/30/21 ?1637 07/31/21 ?0324 08/01/21 ?7673 08/02/21 ?0253  ?BUN 15 19 27* 28* 14 10  ?CREATININE 0.84 0.92 1.68* 1.40* 0.94 0.91  ? ?GERD (gastroesophageal reflux disease) ?-Continue PPI ? ?Depression ?-We will continue Lexapro and trazodone. ? ?Multiple sclerosis ?-She is apparently in remission. ?-Patient states that she was recently started on gabapentin but she is feeling side effects of them and wants to stop them.  After discussion with patient, I started her on Lyrica. ? ?Goals of care ?  Code Status: Full Code  ? ? ?Mobility: Encourage ambulation ? ?Nutritional status:  ?Body mass index is 34.15 kg/m?.  ?  ?  ? ? ? ? ?Diet:  ?Diet Order   ? ?       ?  DIET DYS 3 Room service appropriate? Yes; Fluid consistency: Thin  Diet effective now       ?  ? ?  ?  ? ?  ? ? ?DVT prophylaxis:  ?enoxaparin (LOVENOX) injection 40 mg Start:  07/31/21 1000 ?  ?Antimicrobials: IV Unasyn ?Fluid: Not on IV fluid ?Consultants: Oral surgeon ?Family Communication: None at bedside ? ?Status is: Observation ? ?Continue in-hospital care because: Needs IV antibiotics, trismus and pain remains.Marland Kitchen   ?Level of care: Med-Surg  ? ?Dispo: The patient is from: Home ?             Anticipated d/c is to: Pending clinical course ?             Patient currently is not medically stable to d/c. ?  Difficult to place patient No ? ? ? ? ?Infusions:   ? ampicillin-sulbactam (UNASYN) IV 3 g (08/02/21 1042)  ? ? ?Scheduled Meds: ? cholecalciferol  1,000 Units Oral Daily  ? enoxaparin (LOVENOX) injection  40 mg Subcutaneous Q24H  ? escitalopram  10 mg Oral QHS  ? multivitamin with minerals  1 tablet Oral Daily  ? pantoprazole  40 mg Oral Daily  ? pregabalin  50 mg Oral BID  ? ? ?PRN meds: ?acetaminophen **OR** acetaminophen, aspirin EC, hydrALAZINE, magnesium hydroxide, ondansetron **OR** ondansetron (ZOFRAN) IV, oxyCODONE-acetaminophen, traZODone  ? ?Antimicrobials: ?Anti-infectives (From admission, onward)  ? ? Start     Dose/Rate Route Frequency Ordered Stop  ? 08/01/21 0600  Ampicillin-Sulbactam (UNASYN) 3 g in sodium chloride 0.9 % 100 mL IVPB  Status:  Discontinued       ? 3 g ?200 mL/hr over 30 Minutes Intravenous On call to O.R. 07/31/21 1659 08/01/21 1717  ? 07/31/21 1100  Ampicillin-Sulbactam (UNASYN) 3 g in sodium chloride 0.9 % 100 mL IVPB       ? 3 g ?200 mL/hr over 30 Minutes Intravenous Every 6 hours 07/31/21 1048    ? 07/30/21 2200  clindamycin (CLEOCIN) IVPB 600 mg  Status:  Discontinued       ? 600 mg ?100 mL/hr over 30 Minutes Intravenous Every 8 hours 07/30/21 2140 07/31/21 1024  ? 07/30/21 1930  clindamycin (CLEOCIN) IVPB 600 mg       ? 600 mg ?100 mL/hr over 30 Minutes Intravenous  Once 07/30/21 1916 07/30/21 2036  ? ?  ? ? ?Objective: ?Vitals:  ? 08/02/21 0503 08/02/21 0725  ?BP: (!) 142/59 (!) 146/66  ?Pulse: (!) 103 (!) 101  ?Resp: 17 16  ?Temp: 97.8 ?F (36.6 ?C) (!) 97.4 ?F (36.3 ?C)  ?SpO2: 93% 95%  ? ? ?Intake/Output Summary (Last 24 hours) at 08/02/2021 1435 ?Last data filed at 08/01/2021 1645 ?Gross per 24 hour  ?Intake 100 ml  ?Output --  ?Net 100 ml  ? ?Filed Weights  ? 07/31/21 1852  ?Weight: 84.7 kg  ? ?Weight change:  ?Body mass index is 34.15 kg/m?.  ? ?Physical Exam: ?General exam: Pleasant, elderly African-American female.  Distress because of Christmas ?Skin: No rashes, lesions or ulcers. ?HEENT: Atraumatic, normocephalic, no  obvious bleeding.  Swelling on the left side of the face postsurgically.  ?Lungs: Clear to auscultation bilaterally ?CVS: Regular rate and rhythm, no murmur ?GI/Abd soft, nontender, nondistended, bowel sound present ?CNS: Alert, awake, oriented x3 ?Psychiatry: Mood appropriate ?Extremities: No pedal edema, no calf tenderness ? ?Data Review: I have personally reviewed the laboratory data and studies available. ? ?F/u labs ordered ?Unresulted Labs (From admission, onward)  ? ?  Start     Ordered  ? 08/01/21 0500  CBC with Differential/Platelet  Daily,   R     ? 07/31/21 1111  ? 08/01/21 0102  Basic metabolic panel  Daily,   R     ?  07/31/21 1111  ? ?  ?  ? ?  ? ? ?Signed, ?Terrilee Croak, MD ?[facility] ?08/02/2021 ? ? ? ? ? ? ? ? ? ? ?  ?

## 2021-08-02 NOTE — Plan of Care (Signed)
  Problem: Clinical Measurements: Goal: Ability to maintain clinical measurements within normal limits will improve Outcome: Progressing Goal: Will remain free from infection Outcome: Progressing   Problem: Activity: Goal: Risk for activity intolerance will decrease Outcome: Progressing   Problem: Nutrition: Goal: Adequate nutrition will be maintained Outcome: Progressing   

## 2021-08-03 LAB — CBC WITH DIFFERENTIAL/PLATELET
Abs Immature Granulocytes: 0.04 10*3/uL (ref 0.00–0.07)
Basophils Absolute: 0 10*3/uL (ref 0.0–0.1)
Basophils Relative: 0 %
Eosinophils Absolute: 0 10*3/uL (ref 0.0–0.5)
Eosinophils Relative: 0 %
HCT: 32.8 % — ABNORMAL LOW (ref 36.0–46.0)
Hemoglobin: 10.6 g/dL — ABNORMAL LOW (ref 12.0–15.0)
Immature Granulocytes: 0 %
Lymphocytes Relative: 16 %
Lymphs Abs: 1.8 10*3/uL (ref 0.7–4.0)
MCH: 27.4 pg (ref 26.0–34.0)
MCHC: 32.3 g/dL (ref 30.0–36.0)
MCV: 84.8 fL (ref 80.0–100.0)
Monocytes Absolute: 1.1 10*3/uL — ABNORMAL HIGH (ref 0.1–1.0)
Monocytes Relative: 10 %
Neutro Abs: 8.5 10*3/uL — ABNORMAL HIGH (ref 1.7–7.7)
Neutrophils Relative %: 74 %
Platelets: 243 10*3/uL (ref 150–400)
RBC: 3.87 MIL/uL (ref 3.87–5.11)
RDW: 13.2 % (ref 11.5–15.5)
WBC: 11.4 10*3/uL — ABNORMAL HIGH (ref 4.0–10.5)
nRBC: 0 % (ref 0.0–0.2)

## 2021-08-03 LAB — BASIC METABOLIC PANEL
Anion gap: 9 (ref 5–15)
BUN: 13 mg/dL (ref 8–23)
CO2: 22 mmol/L (ref 22–32)
Calcium: 8.3 mg/dL — ABNORMAL LOW (ref 8.9–10.3)
Chloride: 111 mmol/L (ref 98–111)
Creatinine, Ser: 1.01 mg/dL — ABNORMAL HIGH (ref 0.44–1.00)
GFR, Estimated: 60 mL/min — ABNORMAL LOW (ref >60)
Glucose, Bld: 116 mg/dL — ABNORMAL HIGH (ref 70–99)
Potassium: 3.6 mmol/L (ref 3.5–5.1)
Sodium: 142 mmol/L (ref 135–145)

## 2021-08-03 MED ORDER — OXYCODONE-ACETAMINOPHEN 5-325 MG PO TABS: 1.0000 | ORAL_TABLET | Freq: Four times a day (QID) | ORAL | 0 refills | Status: AC | PRN

## 2021-08-03 MED ORDER — PREGABALIN 50 MG PO CAPS: 50.0000 mg | ORAL_CAPSULE | Freq: Two times a day (BID) | ORAL | 0 refills | Status: DC

## 2021-08-03 MED ORDER — AMOXICILLIN-POT CLAVULANATE 875-125 MG PO TABS: 1.0000 | ORAL_TABLET | Freq: Two times a day (BID) | ORAL | 0 refills | Status: AC

## 2021-08-03 MED ORDER — AMOXICILLIN-POT CLAVULANATE 875-125 MG PO TABS: 1.0000 | ORAL_TABLET | Freq: Two times a day (BID) | ORAL | 0 refills | Status: DC

## 2021-08-03 MED ORDER — CHLORHEXIDINE GLUCONATE 0.12 % MT SOLN: 15.0000 mL | Freq: Three times a day (TID) | OROMUCOSAL | 0 refills | Status: DC

## 2021-08-03 MED ORDER — OXYCODONE-ACETAMINOPHEN 5-325 MG PO TABS: 1.0000 | ORAL_TABLET | Freq: Four times a day (QID) | ORAL | 0 refills | Status: DC | PRN

## 2021-08-03 MED ORDER — CHLORHEXIDINE GLUCONATE 0.12 % MT SOLN: 15.0000 mL | Freq: Three times a day (TID) | OROMUCOSAL | 0 refills | Status: AC

## 2021-08-03 NOTE — Progress Notes (Signed)
[name] to be D/C'd  per MD order.  Discussed with the patient and all questions fully answered. ? ?VSS, Skin clean, dry and intact without evidence of skin break down, no evidence of skin tears noted. ? ?IV catheter discontinued intact. Site without signs and symptoms of complications. Dressing and pressure applied. ? ?An After Visit Summary was printed and given to the patient. Patient received prescription. ? ?D/c education completed with patient/family including follow up instructions, medication list, d/c activities limitations if indicated, with other d/c instructions as indicated by MD - patient able to verbalize understanding, all questions fully answered.  ? ?Patient instructed to return to ED, call 911, or call MD for any changes in condition.  ? ?Patient to be escorted via [place], and D/C home via private auto.  ?

## 2021-08-03 NOTE — Discharge Summary (Addendum)
Physician Discharge Summary  JAXSYN CATALFAMO IFO:[id number] DOB: [birth year] DOA: 07/30/2021  PCP: Cipriano Mile, NP  Admit date: 07/30/2021 Discharge date: 08/03/2021  Admitted From: Home Discharge disposition: Home  Recommendations at discharge:  Continue antibiotics and Peridex rinse for 7 days. Follow-up with oral surgeon as an outpatient.   Brief narrative: Deborah Reid is a 71 y.o. female with PMH significant for multiple sclerosis, rheumatoid arthritis, hepatitis C, HTN, GERD, asthma, depression. Patient presented to the ED on 3/8 with complaint of left facial swelling, pain, progressively worsening for the last couple weeks to the point where she now has significant limitation in opening her mouth. She was seen by a dentist on 3/2 and was referred to an oral surgeon but her symptoms progressed while waiting for the appointment.  In the ED, patient was afebrile, and hemodynamically stable Labs with unremarkable CBC, BMP with creatinine elevated to 1.68 CT soft tissue neck with contrast showed an 1.5 x 0.8 x 1.5 cm odontogenic abscess positioned along the posterior left mandibular body. Associated soft tissue swelling and inflammatory stranding within the adjacent left masticator and submandibular spaces consistent with regional cellulitis. Periapical lucency noted about the root of the left second mandibular molar, suspected to be the source of infection.  Patient was started on IV clindamycin and admitted to hospitalist service.   Subjective: Patient was seen and examined this morning.   Lying on bed.  Not in distress.  No new symptoms.  Feels better.  Trismus improving.  Pain improving. Feels ready to go home.  Principal Problem:   Facial cellulitis Active Problems:   Dental abscess   AKI (acute kidney injury) ([place])   GERD (gastroesophageal reflux disease)   Multiple sclerosis ([place])   Depression   Assessment and Plan: Facial cellulitis Odontogenic abscess s/p I&D  and extraction of tooth -CT scan finding as above showing an abscess.  Her clinical symptoms is disproportionately severe than the scan finding.   -Co. surgery consult appreciated. -3/10, patient underwent surgical removal of tooth #18, and I&D of masticator space abscess. -Currently on IV Unasyn. She does not have a drain or suture to be removed.  -Feels better.  Pain improving.  Creatinine is improving.  Per oral surgery recommendation, will discharge her home today with oral Augmentin twice daily X 7 days and Peridex rinse 3 times daily for 7 days.  To follow-up in clinic in 1 week.  AKI (acute kidney injury)  -Likely due to poor oral intake secondary to trismus.  -Improved with IV fluid. Recent Labs    03/10/21 1333 05/23/21 1746 07/30/21 1637 07/31/21 0324 08/01/21 0044 08/02/21 0253 08/03/21 0059  BUN 15 19 27* 28* '14 10 13  '$ CREATININE 0.84 0.92 1.68* 1.40* 0.94 0.91 1.01*   GERD (gastroesophageal reflux disease) -Continue PPI  Depression -We will continue Lexapro and trazodone.  Multiple sclerosis -She is apparently in remission. -Patient states that she was recently started on gabapentin but she is feeling side effects of them and wants to stop them.  After discussion with patient, I started her on Lyrica.  She is feeling better with that and wants to continue.  Prescription given for a month.  Goals of care   Code Status: Full Code    Mobility: Encourage ambulation  Nutritional status:  Body mass index is 34.15 kg/m.       Wounds:  - Incision (Closed) 08/01/21 Lip Left (Active)  Date First Assessed/Time First Assessed: 08/01/21 1422   Location: Lip  Location  Orientation: Left    Assessments 08/01/2021  2:43 PM 08/03/2021  9:11 AM  Dressing Type Gauze (Comment) Gauze (Comment)  Dressing Clean, Dry, Intact --  Drainage Amount None --  Treatment Ice applied --     No Linked orders to display    Discharge Exam:   Vitals:   08/02/21 1716 08/02/21 2100  08/03/21 0506 08/03/21 0901  BP: (!) 142/66 130/62 (!) 148/74 (!) 156/73  Pulse:  100 95 92  Resp:  '16 17 18  '$ Temp:  98 F (36.7 C) 98.6 F (37 C) 98.3 F (36.8 C)  TempSrc:  Oral Oral Oral  SpO2:  95% 96% 99%  Weight:      Height:        Body mass index is 34.15 kg/m.  General exam: Pleasant, elderly African-American female.  Not in pain Skin: No rashes, lesions or ulcers. HEENT: Atraumatic, normocephalic, no obvious bleeding.  Improving swelling on the left side of the face postsurgically.  Lungs: Clear to auscultation bilaterally CVS: Regular rate and rhythm, no murmur GI/Abd soft, nontender, nondistended, bowel sound present CNS: Alert, awake, oriented x3 Psychiatry: Mood appropriate Extremities: No pedal edema, no calf tenderness  Follow ups:    Follow-up Information     Cipriano Mile, NP Follow up.   Contact information: [place] [address] [phone]         Leonie Man, MD .   Specialty: Cardiology Contact information: [address] [place] [address] [phone]                 Discharge Instructions:   Discharge Instructions     Call MD for:  difficulty breathing, headache or visual disturbances   Complete by: As directed    Call MD for:  extreme fatigue   Complete by: As directed    Call MD for:  hives   Complete by: As directed    Call MD for:  persistant dizziness or light-headedness   Complete by: As directed    Call MD for:  persistant nausea and vomiting   Complete by: As directed    Call MD for:  severe uncontrolled pain   Complete by: As directed    Call MD for:  temperature >100.4   Complete by: As directed    Diet general   Complete by: As directed    Discharge instructions   Complete by: As directed    Recommendations at discharge:   Continue antibiotics and Peridex rinse for 7 days.  Follow-up with oral surgeon as an outpatient.  General discharge  instructions: Follow with Primary MD Cipriano Mile, NP in 7 days  Please request your PCP  to go over your hospital tests, procedures, radiology results at the follow up. Please get your medicines reviewed and adjusted.  Your PCP may decide to repeat certain labs or tests as needed. Do not drive, operate heavy machinery, perform activities at heights, swimming or participation in water activities or provide baby sitting services if your were admitted for syncope or siezures until you have seen by Primary MD or a Neurologist and advised to do so again. [place] Controlled Substance Reporting System database was reviewed. Do not drive, operate heavy machinery, perform activities at heights, swim, participate in water activities or provide baby-sitting services while on medications for pain, sleep and mood until your outpatient physician has reevaluated you and advised to do so again.  You are strongly recommended to comply with the dose, frequency  and duration of prescribed medications. Activity: As tolerated with Full fall precautions use walker/cane & assistance as needed Avoid using any recreational substances like cigarette, tobacco, alcohol, or non-prescribed drug. If you experience worsening of your admission symptoms, develop shortness of breath, life threatening emergency, suicidal or homicidal thoughts you must seek medical attention immediately by calling 911 or calling your MD immediately  if symptoms less severe. You must read complete instructions/literature along with all the possible adverse reactions/side effects for all the medicines you take and that have been prescribed to you. Take any new medicine only after you have completely understood and accepted all the possible adverse reactions/side effects.  Wear Seat belts while driving. You were cared for by a hospitalist during your hospital stay. If you have any questions about your discharge medications or the care you received while  you were in the hospital after you are discharged, you can call the unit and ask to speak with the hospitalist or the covering physician. Once you are discharged, your primary care physician will handle any further medical issues. Please note that NO REFILLS for any discharge medications will be authorized once you are discharged, as it is imperative that you return to your primary care physician (or establish a relationship with a primary care physician if you do not have one).   Discharge wound care:   Complete by: As directed    Increase activity slowly   Complete by: As directed        Discharge Medications:   Allergies as of 08/03/2021       Reactions   Gabapentin Other (See Comments)   Patient feels hot in different areas of her body from head to toe        Medication List     STOP taking these medications    traZODone 50 MG tablet Commonly known as: DESYREL       TAKE these medications    amoxicillin-clavulanate 875-125 MG tablet Commonly known as: Augmentin Take 1 tablet by mouth 2 (two) times daily for 7 days. What changed: when to take this   atorvastatin 40 MG tablet Commonly known as: LIPITOR Take 40 mg by mouth daily.   B-12 PO Take 1 tablet by mouth daily.   chlorhexidine 0.12 % solution Commonly known as: PERIDEX Use as directed 15 mLs in the mouth or throat 3 (three) times daily for 7 days.   cholecalciferol 1000 units tablet Commonly known as: VITAMIN D Take 1 tablet (1,000 Units total) by mouth daily.   escitalopram 10 MG tablet Commonly known as: LEXAPRO Take 1 tablet (10 mg total) by mouth at bedtime.   ibuprofen 800 MG tablet Commonly known as: ADVIL Take 800 mg by mouth every 6 (six) hours as needed for moderate pain, mild pain, headache or fever.   multivitamin tablet Take 1 tablet by mouth daily. Centrum.   omeprazole 20 MG capsule Commonly known as: PRILOSEC Take 20 mg by mouth daily.   oxyCODONE-acetaminophen 5-325 MG  tablet Commonly known as: PERCOCET/ROXICET Take 1 tablet by mouth every 6 (six) hours as needed for up to 5 days for moderate pain.   pregabalin 50 MG capsule Commonly known as: LYRICA Take 1 capsule (50 mg total) by mouth 2 (two) times daily.               Discharge Care Instructions  (From admission, onward)           Start     Ordered   08/03/21  0000  Discharge wound care:        08/03/21 1146             The results of significant diagnostics from this hospitalization (including imaging, microbiology, ancillary and laboratory) are listed below for reference.    Procedures and Diagnostic Studies:   DG Orthopantogram  Result Date: 07/31/2021 CLINICAL DATA:  Pain and swelling of the left jaw. EXAM: ORTHOPANTOGRAM/PANORAMIC COMPARISON:  CT scan 07/30/2021 FINDINGS: Periapical lucency is noted most around the second left mandibular molar. This correlates with the CT scan findings. I do not see any obvious dental caries. IMPRESSION: 1. Periapical lucency around the second left mandibular molar. 2. No obvious dental caries. Electronically Signed   By: Marijo Sanes M.D.   On: 07/31/2021 12:30   CT Soft Tissue Neck W Contrast  Result Date: 07/30/2021 CLINICAL DATA:  Initial evaluation for acute left-sided facial and neck swelling for 1 week. EXAM: CT NECK WITH CONTRAST TECHNIQUE: Multidetector CT imaging of the neck was performed using the standard protocol following the bolus administration of intravenous contrast. RADIATION DOSE REDUCTION: This exam was performed according to the departmental dose-optimization program which includes automated exposure control, adjustment of the mA and/or kV according to patient size and/or use of iterative reconstruction technique. CONTRAST:  37m OMNIPAQUE IOHEXOL 300 MG/ML  SOLN COMPARISON:  None available. FINDINGS: Pharynx and larynx: Oral cavity within normal limits. Periapical lucency noted about the left second mandibular molar. There  is soft tissue swelling with inflammatory stranding within the adjacent left masticator space, concerning for acute infection/cellulitis. Mild extension into the left submandibular space as well. Superimposed rim enhancing hypodense collection positioned along the posterior left mandibular body measures 1.5 x 0.8 x 1.5 cm, consistent with an odontogenic abscess (series 3, image 59). Oropharynx and nasopharynx within normal limits. No retropharyngeal collection or swelling. Epiglottis grossly within normal limits. Vallecula is effaced. Hypopharynx and supraglottic larynx demonstrate no other acute finding. Glottis within normal limits. Subglottic airway clear. Salivary glands: Salivary glands including the parotid and submandibular glands are within normal limits. Thyroid: Normal. Lymph nodes: Mildly asymmetric prominence of subcentimeter upper left cervical lymph nodes, presumably reactive. No other enlarged or pathologic adenopathy within the neck. Vascular: Normal intravascular enhancement seen throughout the neck. Mild atheromatous change about the arch, carotid bifurcations, and carotid siphons. Limited intracranial: Unremarkable. Visualized orbits: Unremarkable. Mastoids and visualized paranasal sinuses: Mild mucoperiosteal thickening present about the ethmoidal air cells. Paranasal sinuses are otherwise clear. Mastoid air cells and middle ear cavities are well pneumatized and free of fluid. Skeleton: No discrete or worrisome osseous lesions. Mild cervical spondylosis for age. Upper chest: Mild scattered subsegmental atelectatic changes seen within the visualized upper lungs. Visualized upper chest demonstrates no other acute finding. Other: None. IMPRESSION: 1.5 x 0.8 x 1.5 cm odontogenic abscess positioned along the posterior left mandibular body. Associated soft tissue swelling and inflammatory stranding within the adjacent left masticator and submandibular spaces consistent with regional cellulitis.  Periapical lucency noted about the root of the left second mandibular molar, suspected to be the source of infection. Electronically Signed   By: BJeannine BogaM.D.   On: 07/30/2021 20:20     Labs:   Basic Metabolic Panel: Recent Labs  Lab 07/30/21 1637 07/31/21 0324 08/01/21 0044 08/02/21 0253 08/03/21 0059  NA 139 139 142 137 142  K 3.7 5.1 3.5 3.6 3.6  CL 107 111 113* 107 111  CO2 21* 21* '22 22 22  '$ GLUCOSE 112* 93 89 197*  116*  BUN 27* 28* '14 10 13  '$ CREATININE 1.68* 1.40* 0.94 0.91 1.01*  CALCIUM 9.3 9.1 8.6* 8.5* 8.3*   GFR Estimated Creatinine Clearance: 51.5 mL/min (A) (by C-G formula based on SCr of 1.01 mg/dL (H)). Liver Function Tests: No results for input(s): AST, ALT, ALKPHOS, BILITOT, PROT, ALBUMIN in the last 168 hours. No results for input(s): LIPASE, AMYLASE in the last 168 hours. No results for input(s): AMMONIA in the last 168 hours. Coagulation profile No results for input(s): INR, PROTIME in the last 168 hours.  CBC: Recent Labs  Lab 07/30/21 1637 07/31/21 0324 08/01/21 0044 08/02/21 0253 08/03/21 0059  WBC 7.2 7.0 6.2 8.8 11.4*  NEUTROABS 3.9  --  3.2 7.6 8.5*  HGB 13.0 12.3 11.6* 11.6* 10.6*  HCT 40.5 39.7 36.4 35.9* 32.8*  MCV 86.5 88.2 86.5 84.9 84.8  PLT 239 227 208 235 243   Cardiac Enzymes: No results for input(s): CKTOTAL, CKMB, CKMBINDEX, TROPONINI in the last 168 hours. BNP: Invalid input(s): POCBNP CBG: No results for input(s): GLUCAP in the last 168 hours. D-Dimer No results for input(s): DDIMER in the last 72 hours. Hgb A1c No results for input(s): HGBA1C in the last 72 hours. Lipid Profile No results for input(s): CHOL, HDL, LDLCALC, TRIG, CHOLHDL, LDLDIRECT in the last 72 hours. Thyroid function studies No results for input(s): TSH, T4TOTAL, T3FREE, THYROIDAB in the last 72 hours.  Invalid input(s): FREET3 Anemia work up No results for input(s): VITAMINB12, FOLATE, FERRITIN, TIBC, IRON, RETICCTPCT in the last 72  hours. Microbiology Recent Results (from the past 240 hour(s))  Resp Panel by RT-PCR (Flu A&B, Covid) Nasopharyngeal Swab     Status: None   Collection Time: 07/30/21  4:29 PM   Specimen: Nasopharyngeal Swab; Nasopharyngeal(NP) swabs in vial transport medium  Result Value Ref Range Status   SARS Coronavirus 2 by RT PCR NEGATIVE NEGATIVE Final    Comment: (NOTE) SARS-CoV-2 target nucleic acids are NOT DETECTED.  The SARS-CoV-2 RNA is generally detectable in upper respiratory specimens during the acute phase of infection. The lowest concentration of SARS-CoV-2 viral copies this assay can detect is 138 copies/mL. A negative result does not preclude SARS-Cov-2 infection and should not be used as the sole basis for treatment or other patient management decisions. A negative result may occur with  improper specimen collection/handling, submission of specimen other than nasopharyngeal swab, presence of viral mutation(s) within the areas targeted by this assay, and inadequate number of viral copies(<138 copies/mL). A negative result must be combined with clinical observations, patient history, and epidemiological information. The expected result is Negative.  Fact Sheet for Patients:  EntrepreneurPulse.com.au  Fact Sheet for Healthcare Providers:  IncredibleEmployment.be  This test is no t yet approved or cleared by the [place] FDA and  has been authorized for detection and/or diagnosis of SARS-CoV-2 by FDA under an Emergency Use Authorization (EUA). This EUA will remain  in effect (meaning this test can be used) for the duration of the COVID-19 declaration under Section 564(b)(1) of the Act, 21 U.S.C.section 360bbb-3(b)(1), unless the authorization is terminated  or revoked sooner.       Influenza A by PCR NEGATIVE NEGATIVE Final   Influenza B by PCR NEGATIVE NEGATIVE Final    Comment: (NOTE) The Xpert Xpress SARS-CoV-2/FLU/RSV plus assay  is intended as an aid in the diagnosis of influenza from Nasopharyngeal swab specimens and should not be used as a sole basis for treatment. Nasal washings and aspirates are unacceptable for Xpert Xpress  SARS-CoV-2/FLU/RSV testing.  Fact Sheet for Patients: EntrepreneurPulse.com.au  Fact Sheet for Healthcare Providers: IncredibleEmployment.be  This test is not yet approved or cleared by the [place] FDA and has been authorized for detection and/or diagnosis of SARS-CoV-2 by FDA under an Emergency Use Authorization (EUA). This EUA will remain in effect (meaning this test can be used) for the duration of the COVID-19 declaration under Section 564(b)(1) of the Act, 21 U.S.C. section 360bbb-3(b)(1), unless the authorization is terminated or revoked.  Performed at [facility], [place] [address], [place], [place] [address]   Surgical pcr screen     Status: None   Collection Time: 07/31/21 11:27 PM   Specimen: Nasal Mucosa; Nasal Swab  Result Value Ref Range Status   MRSA, PCR NEGATIVE NEGATIVE Final   Staphylococcus aureus NEGATIVE NEGATIVE Final    Comment: (NOTE) The Xpert SA Assay (FDA approved for NASAL specimens in patients 22 years of age and older), is one component of a comprehensive surveillance program. It is not intended to diagnose infection nor to guide or monitor treatment. Performed at [facility], [place] [address] [place], [place] [address]   Aerobic/Anaerobic Culture w Gram Stain (surgical/deep wound)     Status: None (Preliminary result)   Collection Time: 08/01/21  2:07 PM   Specimen: PATH Cytology Misc. fluid; Body Fluid  Result Value Ref Range Status   Specimen Description ABSCESS  Final   Special Requests LEFT MANDIBULAR  Final   Gram Stain   Final    FEW WBC PRESENT, PREDOMINANTLY PMN RARE GRAM POSITIVE COCCI RARE GRAM NEGATIVE RODS Performed at [facility], [place] [address] [place], [place]  [address]    Culture   Final    CULTURE REINCUBATED FOR BETTER GROWTH NO ANAEROBES ISOLATED; CULTURE IN PROGRESS FOR 5 DAYS    Report Status PENDING  Incomplete    Time coordinating discharge: 35 minutes  Signed: Chassidy Layson  [facility] 08/03/2021, 1:53 PM

## 2021-08-04 ENCOUNTER — Encounter (HOSPITAL_COMMUNITY): Payer: Self-pay | Admitting: Oral Surgery

## 2021-08-06 LAB — AEROBIC/ANAEROBIC CULTURE W GRAM STAIN (SURGICAL/DEEP WOUND): Culture: NORMAL

## 2021-08-06 NOTE — Anesthesia Postprocedure Evaluation (Signed)
Anesthesia Post Note ? ?Patient: Deborah Reid ? ?Procedure(s) Performed: EXCISION AND DRAINAGE OF FACIAL ABCES ?DENTAL RESTORATION/EXTRACTIONS ? ?  ? ?Patient location during evaluation: PACU ?Anesthesia Type: General ?Level of consciousness: awake and alert ?Pain management: pain level controlled ?Vital Signs Assessment: post-procedure vital signs reviewed and stable ?Respiratory status: spontaneous breathing, nonlabored ventilation, respiratory function stable and patient connected to nasal cannula oxygen ?Cardiovascular status: blood pressure returned to baseline and stable ?Postop Assessment: no apparent nausea or vomiting ?Anesthetic complications: no ? ? ?No notable events documented. ? ?Last Vitals:  ?Vitals:  ? 08/03/21 0506 08/03/21 0901  ?BP: (!) 148/74 (!) 156/73  ?Pulse: 95 92  ?Resp: 17 18  ?Temp: 37 ?C 36.8 ?C  ?SpO2: 96% 99%  ?  ?Last Pain:  ?Vitals:  ? 08/03/21 0952  ?TempSrc:   ?PainSc: 2   ? ? ?  ?  ?  ?  ?  ?  ? ?Artie Mcintyre ? ? ? ? ?

## 2021-11-09 ENCOUNTER — Emergency Department (HOSPITAL_COMMUNITY): Payer: Medicare HMO

## 2021-11-09 ENCOUNTER — Encounter (HOSPITAL_COMMUNITY): Payer: Self-pay

## 2021-11-09 ENCOUNTER — Emergency Department (HOSPITAL_COMMUNITY)
Admission: EM | Admit: 2021-11-09 | Discharge: 2021-11-09 | Disposition: A | Discharge status: Home / Self Care | Payer: Medicare HMO | Attending: Emergency Medicine | Admitting: Emergency Medicine

## 2021-11-09 ENCOUNTER — Other Ambulatory Visit: Payer: Self-pay

## 2021-11-09 DIAGNOSIS — R0789 Other chest pain: Secondary | ICD-10-CM | POA: Diagnosis not present

## 2021-11-09 DIAGNOSIS — R110 Nausea: Secondary | ICD-10-CM | POA: Diagnosis not present

## 2021-11-09 DIAGNOSIS — R079 Chest pain, unspecified: Secondary | ICD-10-CM

## 2021-11-09 DIAGNOSIS — J45909 Unspecified asthma, uncomplicated: Secondary | ICD-10-CM | POA: Insufficient documentation

## 2021-11-09 DIAGNOSIS — R197 Diarrhea, unspecified: Secondary | ICD-10-CM | POA: Insufficient documentation

## 2021-11-09 DIAGNOSIS — I10 Essential (primary) hypertension: Secondary | ICD-10-CM | POA: Insufficient documentation

## 2021-11-09 LAB — COMPREHENSIVE METABOLIC PANEL
ALT: 27 U/L (ref 0–44)
AST: 25 U/L (ref 15–41)
Albumin: 3.4 g/dL — ABNORMAL LOW (ref 3.5–5.0)
Alkaline Phosphatase: 79 U/L (ref 38–126)
Anion gap: 7 (ref 5–15)
BUN: 13 mg/dL (ref 8–23)
CO2: 26 mmol/L (ref 22–32)
Calcium: 9.2 mg/dL (ref 8.9–10.3)
Chloride: 107 mmol/L (ref 98–111)
Creatinine, Ser: 0.85 mg/dL (ref 0.44–1.00)
GFR, Estimated: >60 mL/min (ref >60)
Glucose, Bld: 105 mg/dL — ABNORMAL HIGH (ref 70–99)
Potassium: 3.5 mmol/L (ref 3.5–5.1)
Sodium: 140 mmol/L (ref 135–145)
Total Bilirubin: 0.6 mg/dL (ref 0.3–1.2)
Total Protein: 6.8 g/dL (ref 6.5–8.1)

## 2021-11-09 LAB — CBC WITH DIFFERENTIAL/PLATELET
Abs Immature Granulocytes: 0.01 10*3/uL (ref 0.00–0.07)
Basophils Absolute: 0.1 10*3/uL (ref 0.0–0.1)
Basophils Relative: 1 %
Eosinophils Absolute: 0.2 10*3/uL (ref 0.0–0.5)
Eosinophils Relative: 4 %
HCT: 39.2 % (ref 36.0–46.0)
Hemoglobin: 12.5 g/dL (ref 12.0–15.0)
Immature Granulocytes: 0 %
Lymphocytes Relative: 40 %
Lymphs Abs: 1.9 10*3/uL (ref 0.7–4.0)
MCH: 27.9 pg (ref 26.0–34.0)
MCHC: 31.9 g/dL (ref 30.0–36.0)
MCV: 87.5 fL (ref 80.0–100.0)
Monocytes Absolute: 0.6 10*3/uL (ref 0.1–1.0)
Monocytes Relative: 13 %
Neutro Abs: 2 10*3/uL (ref 1.7–7.7)
Neutrophils Relative %: 42 %
Platelets: 207 10*3/uL (ref 150–400)
RBC: 4.48 MIL/uL (ref 3.87–5.11)
RDW: 13.1 % (ref 11.5–15.5)
WBC: 4.7 10*3/uL (ref 4.0–10.5)
nRBC: 0 % (ref 0.0–0.2)

## 2021-11-09 LAB — TROPONIN I (HIGH SENSITIVITY)
Troponin I (High Sensitivity): 3 ng/L (ref <18)
Troponin I (High Sensitivity): 4 ng/L (ref <18)

## 2021-11-09 MED ORDER — FAMOTIDINE IN NACL 20-0.9 MG/50ML-% IV SOLN
20.0000 mg | Freq: Once | INTRAVENOUS | Status: AC
  Administered 2021-11-09: 20 mg via INTRAVENOUS
  Filled 2021-11-09: qty 50

## 2021-11-09 MED ORDER — LIDOCAINE VISCOUS HCL 2 % MT SOLN
15.0000 mL | Freq: Once | OROMUCOSAL | Status: AC
  Administered 2021-11-09: 15 mL via ORAL
  Filled 2021-11-09: qty 15

## 2021-11-09 MED ORDER — ALUM & MAG HYDROXIDE-SIMETH 200-200-20 MG/5ML PO SUSP
30.0000 mL | Freq: Once | ORAL | Status: AC
  Administered 2021-11-09: 30 mL via ORAL
  Filled 2021-11-09: qty 30

## 2021-11-09 NOTE — ED Triage Notes (Signed)
Pt arrived to ED via EMS from home w/ c/o dull CP x 1 week initially rated as 7/10. EMS gave '324mg'$  ASA and 1 SL nitro which relieved CP to 0/10. No cardiac hx per EMS. Pt just had a loss in the family. Initial BP 190/100, after nitro 155/90, HR 90 NSR, RR 20, 98% RA, CBG 133.

## 2021-11-09 NOTE — ED Provider Notes (Signed)
[facility] EMERGENCY DEPARTMENT Provider Note   CSN: [id number] Arrival date & time: 11/09/21  1348     History  Chief Complaint  Patient presents with   Chest Pain    Deborah Reid is a 71 y.o. female.  The history is provided by the patient.  Chest Pain Patient presents with anterior chest pain.  Had episodes of but for the last few weeks.  Come and go.  The last few hours.  Nothing will make it come on and nothing makes it better.  General just finished up in a months.  Not exertional.  Is been able to exert without any effort.  Few months ago did have her mother by.  No fevers or chills.  No coughing.  No known history of cardiac disease.  Initial blood pressure was elevated for EMS at about 190/100.  Given nitroglycerin and symptoms feeling better.  No difficulty breathing.  No domino pain but does feel little nauseous.  States that she has had hepatitis C in the past and has had some diarrhea and is worried that could have something to do with this.    Past Medical History:  Diagnosis Date   Asthma    Depression    GERD (gastroesophageal reflux disease)    Glaucoma    Hepatitis C    Hypertension    Multiple sclerosis ([place])    Rheumatoid arthritis ([place])     Home Medications Prior to Admission medications   Medication Sig Start Date End Date Taking? Authorizing Provider  atorvastatin (LIPITOR) 40 MG tablet Take 40 mg by mouth daily. 05/28/21   [provider]  cholecalciferol (VITAMIN D) 1000 UNITS tablet Take 1 tablet (1,000 Units total) by mouth daily. 09/18/13   Rey, Latina Craver, NP  Cyanocobalamin (B-12 PO) Take 1 tablet by mouth daily.    [provider]  escitalopram (LEXAPRO) 10 MG tablet Take 1 tablet (10 mg total) by mouth at bedtime. 09/18/13   Rey, Latina Craver, NP  ibuprofen (ADVIL) 800 MG tablet Take 800 mg by mouth every 6 (six) hours as needed for moderate pain, mild pain, headache or fever. 07/24/21   [provider]   Multiple Vitamin (MULTIVITAMIN) tablet Take 1 tablet by mouth daily. Centrum.    [provider]  omeprazole (PRILOSEC) 20 MG capsule Take 20 mg by mouth daily. 04/08/21   [provider]  pregabalin (LYRICA) 50 MG capsule Take 1 capsule (50 mg total) by mouth 2 (two) times daily. 08/03/21 09/02/21  Terrilee Croak, MD      Allergies    Gabapentin    Review of Systems   Review of Systems  Cardiovascular:  Positive for chest pain.    Physical Exam Updated Vital Signs BP 121/69   Pulse 64   Temp 98.3 F (36.8 C) (Oral)   Resp 13   Ht '5\' 2"'$  (1.575 m)   Wt 79.4 kg   SpO2 98%   BMI 32.01 kg/m  Physical Exam Vitals and nursing note reviewed.  HENT:     Head: Normocephalic.  Cardiovascular:     Rate and Rhythm: Regular rhythm.  Pulmonary:     Breath sounds: No decreased breath sounds or wheezing.  Chest:     Chest wall: No tenderness.  Abdominal:     Tenderness: There is no abdominal tenderness.  Musculoskeletal:     Right lower leg: No edema.     Left lower leg: No edema.  Skin:  Capillary Refill: Capillary refill takes less than 2 seconds.  Neurological:     Mental Status: She is alert.     ED Results / Procedures / Treatments   Labs (all labs ordered are listed, but only abnormal results are displayed) Labs Reviewed  COMPREHENSIVE METABOLIC PANEL  CBC WITH DIFFERENTIAL/PLATELET  TROPONIN I (HIGH SENSITIVITY)    EKG EKG Interpretation  Date/Time:  Sunday November 09 2021 13:50:08 EDT Ventricular Rate:  65 PR Interval:  183 QRS Duration: 82 QT Interval:  410 QTC Calculation: 427 R Axis:   33 Text Interpretation: Sinus rhythm Low voltage, precordial leads No significant change since last tracing Confirmed by Davonna Belling [phone]) on 11/09/2021 1:56:15 PM  Radiology DG Chest Portable 1 View  Result Date: 11/09/2021 CLINICAL DATA:  Chest pain EXAM: PORTABLE CHEST 1 VIEW COMPARISON:  Chest x-ray 05/23/2021 FINDINGS: Heart size and  mediastinal contours are within normal limits. No suspicious pulmonary opacities identified. No pleural effusion or pneumothorax visualized. No acute osseous abnormality appreciated. IMPRESSION: No acute intrathoracic process identified. Electronically Signed   By: Ofilia Neas M.D.   On: 11/09/2021 14:38    Procedures Procedures    Medications Ordered in ED Medications - No data to display  ED Course/ Medical Decision Making/ A&P                           Medical Decision Making Amount and/or Complexity of Data Reviewed Labs: ordered. Radiology: ordered.   Patient with chest pain.  Anterior chest.  Differential diagnoses long and includes severe pathology cyst attached acute MI, aortic dissection, pneumonia.  Initial EKG reassuring.  Chest x-ray also independently interpreted and showed no pneumonia.  Somewhat low risk by story.  Has been able to do exertion without chest pain.  Will get blood work.  Likely will need 2 troponins but if negative hopefully can be discharged home with outpatient follow-up.  Care of patient over to Dr. Darl Householder  I reviewed previous echocardiogram report       Final Clinical Impression(s) / ED Diagnoses Final diagnoses:  Nonspecific chest pain    Rx / DC Orders ED Discharge Orders     None         Davonna Belling, MD 11/09/21 1501

## 2021-11-09 NOTE — ED Notes (Signed)
Pt reporting CP 5/10 dull achey to the L chest. I asked pt when the pain started and she said 3 hrs ago. Cherylann Ratel MD

## 2021-11-09 NOTE — ED Provider Notes (Signed)
  Physical Exam  BP 131/64   Pulse 71   Temp 98.3 F (36.8 C) (Oral)   Resp 20   Ht '5\' 2"'$  (1.575 m)   Wt 79.4 kg   SpO2 100%   BMI 32.01 kg/m   Physical Exam  Procedures  Procedures  ED Course / MDM    Medical Decision Making Care assumed at 3 pm.  Patient is here with chest pain.  Patient has history of reflux.  Patient has some burning chest pain for a while now.  Signed out pending troponin x2 and reassessment  6 pm Patient told the nurse that she has been having chest pain for the last several hours while she has been in the ER.  Repeat EKG showed no changes.  Initial troponin is negative and will get second troponin.  I think likely GI in origin so we will give Pepcid and GI cocktail and reassess  7:20 PM Patient is now pain-free and I reviewed her labs and independently reviewed her chest x-ray.  Her second troponin is negative now.  Patient is stable for discharge.  She never had a stress test so we will recommend that she follows up with cardiology for stress test  Problems Addressed: Nonspecific chest pain: acute illness or injury  Amount and/or Complexity of Data Reviewed Labs: ordered. Decision-making details documented in ED Course. Radiology: ordered and independent interpretation performed. Decision-making details documented in ED Course. ECG/medicine tests: ordered and independent interpretation performed. Decision-making details documented in ED Course.  Risk OTC drugs. Prescription drug management.    EKG Interpretation  Date/Time:  Sunday November 09 2021 18:58:12 EDT Ventricular Rate:  69 PR Interval:  176 QRS Duration: 83 QT Interval:  403 QTC Calculation: 432 R Axis:   34 Text Interpretation: Sinus rhythm Low voltage, precordial leads No significant change since last tracing Confirmed by Wandra Arthurs [phone]) on 11/09/2021 6:59:26 PM              Drenda Freeze, MD 11/09/21 1921

## 2021-11-09 NOTE — Discharge Instructions (Signed)
Please continue taking your Nexium daily  Your heart enzymes are normal right now.  Recommend taking Pepcid 20 mg twice daily as needed for  If you have persistent chest pain I recommend that you see cardiology for stress test  Return to ER if you have worse chest pain, trouble breathing

## 2021-12-05 ENCOUNTER — Encounter: Payer: Self-pay | Admitting: *Deleted

## 2022-01-04 ENCOUNTER — Other Ambulatory Visit: Payer: Self-pay

## 2022-01-04 ENCOUNTER — Emergency Department (HOSPITAL_COMMUNITY): Payer: Medicare HMO

## 2022-01-04 ENCOUNTER — Emergency Department (HOSPITAL_COMMUNITY)
Admission: EM | Admit: 2022-01-04 | Discharge: 2022-01-04 | Disposition: A | Discharge status: Home / Self Care | Payer: Medicare HMO | Attending: Emergency Medicine | Admitting: Emergency Medicine

## 2022-01-04 DIAGNOSIS — R6883 Chills (without fever): Secondary | ICD-10-CM | POA: Insufficient documentation

## 2022-01-04 DIAGNOSIS — R0789 Other chest pain: Secondary | ICD-10-CM | POA: Insufficient documentation

## 2022-01-04 DIAGNOSIS — I10 Essential (primary) hypertension: Secondary | ICD-10-CM | POA: Diagnosis not present

## 2022-01-04 DIAGNOSIS — R110 Nausea: Secondary | ICD-10-CM | POA: Insufficient documentation

## 2022-01-04 DIAGNOSIS — N390 Urinary tract infection, site not specified: Secondary | ICD-10-CM | POA: Diagnosis not present

## 2022-01-04 DIAGNOSIS — J45909 Unspecified asthma, uncomplicated: Secondary | ICD-10-CM | POA: Diagnosis not present

## 2022-01-04 DIAGNOSIS — Z87891 Personal history of nicotine dependence: Secondary | ICD-10-CM | POA: Diagnosis not present

## 2022-01-04 LAB — CBC
HCT: 38.3 % (ref 36.0–46.0)
Hemoglobin: 12 g/dL (ref 12.0–15.0)
MCH: 27.5 pg (ref 26.0–34.0)
MCHC: 31.3 g/dL (ref 30.0–36.0)
MCV: 87.8 fL (ref 80.0–100.0)
Platelets: 185 10*3/uL (ref 150–400)
RBC: 4.36 MIL/uL (ref 3.87–5.11)
RDW: 13.4 % (ref 11.5–15.5)
WBC: 6.1 10*3/uL (ref 4.0–10.5)
nRBC: 0 % (ref 0.0–0.2)

## 2022-01-04 LAB — URINALYSIS, ROUTINE W REFLEX MICROSCOPIC
Bilirubin Urine: NEGATIVE
Glucose, UA: NEGATIVE mg/dL
Hgb urine dipstick: NEGATIVE
Ketones, ur: NEGATIVE mg/dL
Nitrite: NEGATIVE
Protein, ur: NEGATIVE mg/dL
Specific Gravity, Urine: 1.011 (ref 1.005–1.030)
pH: 5 (ref 5.0–8.0)

## 2022-01-04 LAB — TROPONIN I (HIGH SENSITIVITY)
Troponin I (High Sensitivity): 5 ng/L (ref <18)
Troponin I (High Sensitivity): 5 ng/L (ref <18)

## 2022-01-04 LAB — COMPREHENSIVE METABOLIC PANEL
ALT: 17 U/L (ref 0–44)
AST: 26 U/L (ref 15–41)
Albumin: 3.5 g/dL (ref 3.5–5.0)
Alkaline Phosphatase: 82 U/L (ref 38–126)
Anion gap: 6 (ref 5–15)
BUN: 17 mg/dL (ref 8–23)
CO2: 26 mmol/L (ref 22–32)
Calcium: 8.8 mg/dL — ABNORMAL LOW (ref 8.9–10.3)
Chloride: 109 mmol/L (ref 98–111)
Creatinine, Ser: 0.99 mg/dL (ref 0.44–1.00)
GFR, Estimated: >60 mL/min (ref >60)
Glucose, Bld: 97 mg/dL (ref 70–99)
Potassium: 3.9 mmol/L (ref 3.5–5.1)
Sodium: 141 mmol/L (ref 135–145)
Total Bilirubin: 0.8 mg/dL (ref 0.3–1.2)
Total Protein: 6.9 g/dL (ref 6.5–8.1)

## 2022-01-04 LAB — LIPASE, BLOOD: Lipase: 34 U/L (ref 11–51)

## 2022-01-04 MED ORDER — CEPHALEXIN 500 MG PO CAPS: 500.0000 mg | ORAL_CAPSULE | Freq: Three times a day (TID) | ORAL | 0 refills | Status: AC

## 2022-01-04 MED ORDER — SUCRALFATE 1 G PO TABS: 1.0000 g | ORAL_TABLET | Freq: Three times a day (TID) | ORAL | 0 refills | Status: DC

## 2022-01-04 MED ORDER — SODIUM CHLORIDE 0.9 % IV SOLN
1.0000 g | Freq: Once | INTRAVENOUS | Status: AC
  Administered 2022-01-04: 1 g via INTRAVENOUS
  Filled 2022-01-04: qty 10

## 2022-01-04 NOTE — Discharge Instructions (Signed)
It was a pleasure caring for you today in the emergency department.  Please return to the emergency department for any worsening or worrisome symptoms.   Return to the Emergency Department if you have unusual chest pain, pressure, or discomfort, shortness of breath, nausea, vomiting, burping, heartburn, tingling upper body parts, sweating, cold, clammy skin, or racing heartbeat. Call 911 if you think you are having a heart attack.  Follow cardiac diet - avoid fatty & fried foods, don't eat too much red meat, eat lots of fruits & vegetables, and dairy products should be low fat. Please lose weight if you are overweight. Become more active with walking, gardening, or any other activity that gets you to moving.   Please return to the emergency department immediately for any new or concerning symptoms, or if you get worse.

## 2022-01-04 NOTE — ED Notes (Addendum)
Spoke with pts daughter Lenor Derrick, to let her know that she is up for discharge and will be coming home by taxi. Daughter states that she will be at home to help her inside the house.

## 2022-01-04 NOTE — ED Provider Notes (Signed)
[facility] EMERGENCY DEPARTMENT Provider Note   CSN: [id number] Arrival date & time: 01/04/22  4098     History  Chief Complaint  Patient presents with   Chest Pain    Deborah Reid is a 71 y.o. female.  Patient as above with significant medical history as below, including asthma, depression, GERD, hypertension, MS who presents to the ED with complaint of chest pain.  Nausea, dry heaving,  patient reports she began to experience left-sided chest pain approximately 2-3 hours ago.  Pain occurred suddenly, without provocation, resolve spontaneously after around 10 seconds.  She had chills and 1 episode of dry heaving last night for going to bed.  Pain described as burning, sharp.  No recent diet or medication changes.  Reports similar symptoms in the past which did resolve spontaneously.  She was seen in the ED in June with similar complaints.  Patient reports the discomfort is nearly identical to what she is experiencing last time.  No rashes.  No numbness or tingling that seems new; she denies dizziness or headache.  Reports she had some burning with urination yesterday but has since improved.  No abdominal pain.  She was given aspirin and nitroglycerin by EMS.   Past Medical History:  Diagnosis Date   Asthma    Depression    GERD (gastroesophageal reflux disease)    Glaucoma    Hepatitis C    Hypertension    Multiple sclerosis ([place])    Rheumatoid arthritis ([place])     Past Surgical History:  Procedure Laterality Date   CHOLECYSTECTOMY     MASS EXCISION N/A 08/01/2021   Procedure: EXCISION AND DRAINAGE OF FACIAL ABCES;  Surgeon: Newt Lukes, DMD;  Location: [place];  Service: Dentistry;  Laterality: N/A;   TOOTH EXTRACTION N/A 08/01/2021   Procedure: DENTAL RESTORATION/EXTRACTIONS;  Surgeon: Newt Lukes, DMD;  Location: [place];  Service: Dentistry;  Laterality: N/A;     The history is provided by the patient. No language interpreter was used.   Dizziness Associated symptoms: chest pain and nausea   Associated symptoms: no headaches, no palpitations and no shortness of breath   Chest Pain Associated symptoms: nausea   Associated symptoms: no abdominal pain, no back pain, no cough, no dizziness, no dysphagia, no fever, no headache, no numbness, no palpitations and no shortness of breath        Home Medications Prior to Admission medications   Medication Sig Start Date End Date Taking? Authorizing Provider  cephALEXin (KEFLEX) 500 MG capsule Take 1 capsule (500 mg total) by mouth 3 (three) times daily for 7 days. 01/04/22 01/11/22 Yes Wynona Dove A, DO  sucralfate (CARAFATE) 1 g tablet Take 1 tablet (1 g total) by mouth with breakfast, with lunch, and with evening meal for 7 days. 01/04/22 01/11/22 Yes Wynona Dove A, DO  atorvastatin (LIPITOR) 40 MG tablet Take 40 mg by mouth daily. 05/28/21   [provider]  cholecalciferol (VITAMIN D) 1000 UNITS tablet Take 1 tablet (1,000 Units total) by mouth daily. 09/18/13   Rey, Latina Craver, NP  Cyanocobalamin (B-12 PO) Take 1 tablet by mouth daily.    [provider]  escitalopram (LEXAPRO) 10 MG tablet Take 1 tablet (10 mg total) by mouth at bedtime. 09/18/13   Rey, Latina Craver, NP  ibuprofen (ADVIL) 800 MG tablet Take 800 mg by mouth every 6 (six) hours as needed for moderate pain, mild pain, headache or fever. 07/24/21   [provider]  Multiple Vitamin (MULTIVITAMIN) tablet Take 1 tablet by mouth daily. Centrum.    [provider]  omeprazole (PRILOSEC) 20 MG capsule Take 20 mg by mouth daily. 04/08/21   [provider]  pregabalin (LYRICA) 50 MG capsule Take 1 capsule (50 mg total) by mouth 2 (two) times daily. 08/03/21 09/02/21  Terrilee Croak, MD      Allergies    Gabapentin    Review of Systems   Review of Systems  Constitutional:  Negative for activity change, chills and fever.  HENT:  Negative for facial swelling and trouble swallowing.   Eyes:   Negative for discharge and redness.  Respiratory:  Negative for cough and shortness of breath.   Cardiovascular:  Positive for chest pain. Negative for palpitations.  Gastrointestinal:  Positive for nausea. Negative for abdominal pain.  Genitourinary:  Positive for dysuria. Negative for flank pain.  Musculoskeletal:  Negative for back pain and gait problem.  Skin:  Negative for pallor and rash.  Neurological:  Negative for dizziness, syncope, numbness and headaches.    Physical Exam Updated Vital Signs BP (!) 122/54   Pulse 65   Temp 97.9 F (36.6 C) (Oral)   Resp 15   Ht '5\' 2"'$  (1.575 m)   Wt 77.1 kg   SpO2 96%   BMI 31.09 kg/m  Physical Exam Vitals and nursing note reviewed.  Constitutional:      General: She is not in acute distress.    Appearance: Normal appearance. She is well-developed. She is not ill-appearing or diaphoretic.  HENT:     Head: Normocephalic and atraumatic.     Right Ear: External ear normal.     Left Ear: External ear normal.     Nose: Nose normal.     Mouth/Throat:     Mouth: Mucous membranes are moist.  Eyes:     General: No scleral icterus.       Right eye: No discharge.        Left eye: No discharge.     Extraocular Movements: Extraocular movements intact.     Pupils: Pupils are equal, round, and reactive to light.  Cardiovascular:     Rate and Rhythm: Normal rate and regular rhythm.     Pulses: Normal pulses.     Heart sounds: Normal heart sounds.  Pulmonary:     Effort: Pulmonary effort is normal. No respiratory distress.     Breath sounds: Normal breath sounds.  Abdominal:     General: Abdomen is flat.     Palpations: Abdomen is soft.     Tenderness: There is no abdominal tenderness. There is no guarding or rebound.  Musculoskeletal:        General: Normal range of motion.     Cervical back: Normal range of motion.     Right lower leg: No edema.     Left lower leg: No edema.  Skin:    General: Skin is warm and dry.     Capillary  Refill: Capillary refill takes less than 2 seconds.  Neurological:     Mental Status: She is alert and oriented to person, place, and time.     GCS: GCS eye subscore is 4. GCS verbal subscore is 5. GCS motor subscore is 6.     Cranial Nerves: Cranial nerves 2-12 are intact. No dysarthria or facial asymmetry.     Sensory: Sensation is intact.     Motor: Motor function is intact. No tremor.     Coordination:  Coordination is intact.  Psychiatric:        Mood and Affect: Mood normal.        Behavior: Behavior normal.     ED Results / Procedures / Treatments   Labs (all labs ordered are listed, but only abnormal results are displayed) Labs Reviewed  COMPREHENSIVE METABOLIC PANEL - Abnormal; Notable for the following components:      Result Value   Calcium 8.8 (*)    All other components within normal limits  URINALYSIS, ROUTINE W REFLEX MICROSCOPIC - Abnormal; Notable for the following components:   APPearance HAZY (*)    Leukocytes,Ua SMALL (*)    Bacteria, UA MANY (*)    All other components within normal limits  URINE CULTURE  CBC  LIPASE, BLOOD  TROPONIN I (HIGH SENSITIVITY)  TROPONIN I (HIGH SENSITIVITY)    EKG None  Radiology DG Chest Port 1 View  Result Date: 01/04/2022 CLINICAL DATA:  Dizziness and intermittent chest pain. EXAM: PORTABLE CHEST 1 VIEW COMPARISON:  November 09, 2021 FINDINGS: The heart size and mediastinal contours are within normal limits. Both lungs are clear. The visualized skeletal structures are unremarkable. IMPRESSION: No active disease. Electronically Signed   By: Virgina Norfolk M.D.   On: 01/04/2022 04:13    Procedures Procedures    Medications Ordered in ED Medications  cefTRIAXone (ROCEPHIN) 1 g in sodium chloride 0.9 % 100 mL IVPB (1 g Intravenous New Bag/Given 01/04/22 9323)    ED Course/ Medical Decision Making/ A&P                           Medical Decision Making Amount and/or Complexity of Data Reviewed Labs:  ordered. Radiology: ordered.  Risk Prescription drug management.   This patient presents to the ED with chief complaint(s) of cp with pertinent past medical history of ms, htn, asthma which further complicates the presenting complaint. The complaint involves an extensive differential diagnosis and also carries with it a high risk of complications and morbidity.    Differential includes all life-threatening causes for chest pain. This includes but is not exclusive to acute coronary syndrome, aortic dissection, pulmonary embolism, cardiac tamponade, community-acquired pneumonia, pericarditis, musculoskeletal chest wall pain, etc.  . Serious etiologies were considered.   The initial plan is to screening labs, cxr   Additional history obtained: Additional history obtained from  n/a Records reviewed  prior ED visits, prior labs and imaging  Independent labs interpretation:  The following labs were independently interpreted: Urinalysis with questionable UTI.  She has many bacteria, 6 to 10-W BCs, she is having dysuria; will go ahead and treat for UTI.  Given Rocephin in ED, send urine culture.  Renal function stable.  CBC stable.  Is not septic  Troponin negative x2   Independent visualization of imaging: - I independently visualized the following imaging with scope of interpretation limited to determining acute life threatening conditions related to emergency care: CXR, which revealed no acute process  Cardiac monitoring was reviewed and interpreted by myself which shows NSR  Treatment and Reassessment: She is currently asymptomatic  Consultation: - Consulted or discussed management/test interpretation w/ external professional: n/a  Consideration for admission or further workup: Admission was considered    The patient's chest pain is not suggestive of pulmonary embolus, cardiac ischemia, aortic dissection, pericarditis, myocarditis, pulmonary embolism, pneumothorax, pneumonia,  Zoster, or esophageal perforation, or other serious etiology.  Historically not abrupt in onset, tearing or ripping, pulses symmetric. EKG  nonspecific for ischemia/infarction. No dysrhythmias, brugada, WPW, prolonged QT noted.   Troponin negative x2. CXR reviewed. Labs without demonstration of acute pathology unless otherwise noted above. Low HEART Score: 0-3 points (0.9-1.7% risk of MACE).  Given the extremely low risk of these diagnoses further testing and evaluation for these possibilities does not appear to be indicated at this time. Patient in no distress and overall condition improved here in the ED. Detailed discussions were had with the patient regarding current findings, and need for close f/u with PCP or on call doctor. The patient has been instructed to return immediately if the symptoms worsen in any way for re-evaluation. Patient verbalized understanding and is in agreement with current care plan. All questions answered prior to discharge.  Social Determinants of health: Social History   Tobacco Use   Smoking status: Former    Types: Cigarettes    Quit date: 06/20/1973    Years since quitting: 48.5   Smokeless tobacco: Never  Vaping Use   Vaping Use: Never used  Substance Use Topics   Alcohol use: No   Drug use: No            Final Clinical Impression(s) / ED Diagnoses Final diagnoses:  Urinary tract infection without hematuria, site unspecified  Atypical chest pain    Rx / DC Orders ED Discharge Orders          Ordered    cephALEXin (KEFLEX) 500 MG capsule  3 times daily        01/04/22 0734    sucralfate (CARAFATE) 1 g tablet  3 times daily with meals        01/04/22 [place], Perry A, DO 01/04/22 [phone]

## 2022-01-04 NOTE — ED Notes (Signed)
Pt ambulated to bathroom with assistance from this RN

## 2022-01-04 NOTE — ED Triage Notes (Signed)
Pt bib [facility] from home after waking up experiencing dizziness + intermittent chest pain. Pt seen last month for same. Pt reported episode of dry heaving. ASA + nitro given PTA with no improvement. 500 fluid given.   BP 124/70 HR 70 SPO2 100% RA CBG 110

## 2022-01-06 LAB — URINE CULTURE: Culture: >=100000 — AB

## 2022-01-07 ENCOUNTER — Telehealth: Payer: Self-pay | Admitting: *Deleted

## 2022-01-07 NOTE — Telephone Encounter (Signed)
Post ED Visit - Positive Culture Follow-up  Culture report reviewed by antimicrobial stewardship pharmacist: [place] Team '[]'$  Elenor Quinones, Pharm.D. '[]'$  Heide Guile, Pharm.D., BCPS AQ-ID '[]'$  Parks Neptune, Pharm.D., BCPS '[]'$  Alycia Rossetti, Pharm.D., BCPS '[]'$  [place], Pharm.D., BCPS, AAHIVP '[]'$  Legrand Como, Pharm.D., BCPS, AAHIVP '[x]'$  Salome Arnt, PharmD, BCPS '[]'$  Johnnette Gourd, PharmD, BCPS '[]'$  Hughes Better, PharmD, BCPS '[]'$  Leeroy Cha, PharmD '[]'$  Laqueta Linden, PharmD, BCPS '[]'$  Albertina Parr, PharmD  [place] Team '[]'$  Leodis Sias, PharmD '[]'$  Lindell Spar, PharmD '[]'$  Royetta Asal, PharmD '[]'$  Graylin Shiver, Rph '[]'$  Rema Fendt) Glennon Mac, PharmD '[]'$  Arlyn Dunning, PharmD '[]'$  Netta Cedars, PharmD '[]'$  Dia Sitter, PharmD '[]'$  Leone Haven, PharmD '[]'$  Gretta Arab, PharmD '[]'$  Theodis Shove, PharmD '[]'$  Peggyann Juba, PharmD '[]'$  Reuel Boom, PharmD   Positive urine culture Treated with Cephalexin, organism sensitive to the same and no further patient follow-up is required at this time.  Harlon Flor [facility] 01/07/2022, 10:16 AM

## 2022-02-17 ENCOUNTER — Encounter (HOSPITAL_COMMUNITY): Payer: Self-pay | Admitting: Emergency Medicine

## 2022-02-17 ENCOUNTER — Emergency Department (HOSPITAL_COMMUNITY)
Admission: EM | Admit: 2022-02-17 | Discharge: 2022-02-18 | Disposition: A | Discharge status: Home / Self Care | Payer: Medicare HMO | Attending: Emergency Medicine | Admitting: Emergency Medicine

## 2022-02-17 ENCOUNTER — Other Ambulatory Visit: Payer: Self-pay

## 2022-02-17 ENCOUNTER — Emergency Department (HOSPITAL_COMMUNITY): Payer: Medicare HMO

## 2022-02-17 DIAGNOSIS — I10 Essential (primary) hypertension: Secondary | ICD-10-CM | POA: Insufficient documentation

## 2022-02-17 DIAGNOSIS — Z20822 Contact with and (suspected) exposure to covid-19: Secondary | ICD-10-CM | POA: Diagnosis not present

## 2022-02-17 DIAGNOSIS — R519 Headache, unspecified: Secondary | ICD-10-CM | POA: Diagnosis present

## 2022-02-17 DIAGNOSIS — R42 Dizziness and giddiness: Secondary | ICD-10-CM | POA: Insufficient documentation

## 2022-02-17 DIAGNOSIS — Z8669 Personal history of other diseases of the nervous system and sense organs: Secondary | ICD-10-CM | POA: Insufficient documentation

## 2022-02-17 DIAGNOSIS — J45909 Unspecified asthma, uncomplicated: Secondary | ICD-10-CM | POA: Diagnosis not present

## 2022-02-17 DIAGNOSIS — G35 Multiple sclerosis: Secondary | ICD-10-CM

## 2022-02-17 DIAGNOSIS — J069 Acute upper respiratory infection, unspecified: Secondary | ICD-10-CM | POA: Insufficient documentation

## 2022-02-17 LAB — BASIC METABOLIC PANEL
Anion gap: 9 (ref 5–15)
BUN: 19 mg/dL (ref 8–23)
CO2: 24 mmol/L (ref 22–32)
Calcium: 9.2 mg/dL (ref 8.9–10.3)
Chloride: 109 mmol/L (ref 98–111)
Creatinine, Ser: 0.87 mg/dL (ref 0.44–1.00)
GFR, Estimated: >60 mL/min (ref >60)
Glucose, Bld: 115 mg/dL — ABNORMAL HIGH (ref 70–99)
Potassium: 3.5 mmol/L (ref 3.5–5.1)
Sodium: 142 mmol/L (ref 135–145)

## 2022-02-17 LAB — TROPONIN I (HIGH SENSITIVITY): Troponin I (High Sensitivity): 4 ng/L (ref <18)

## 2022-02-17 NOTE — ED Triage Notes (Signed)
Patient here with bad headache, she state that she has had some palpitations and salty taste in her mouth.  Patient denies any shortness of breath at this time.  Patient is CAOx4.

## 2022-02-17 NOTE — ED Notes (Signed)
Debarah Crape - daughter - [phone]

## 2022-02-17 NOTE — ED Provider Triage Note (Signed)
Emergency Medicine Provider Triage Evaluation Note  Deborah Reid , a 70 y.o. female  was evaluated in triage.  Pt complains of flu like symptoms x2 days.  States headache, salty taste in mouth, cough the thick sputum.  Denies sick contacts or known exposures.  Also reports some palpitations.  Vomiting x1 at home.  No diarrhea.  Denies chest pain or SOB.  Review of Systems  Positive: Flu like symptoms Negative: fever  Physical Exam  BP (!) 154/75 (BP Location: Right Arm)   Pulse 71   Temp 98.5 F (36.9 C) (Oral)   Resp 16   SpO2 95%   Gen:   Awake, no distress   Resp:  Normal effort  MSK:   Moves extremities without difficulty  Other:    Medical Decision Making  Medically screening exam initiated at 10:38 PM.  Appropriate orders placed.  Deborah Reid was informed that the remainder of the evaluation will be completed by another provider, this initial triage assessment does not replace that evaluation, and the importance of remaining in the ED until their evaluation is complete.  Flu like symptoms.  Also reports palpitations but denies chest pain/SOB.  Will check EKG, labs, CXR, covid/flu screen.   Larene Pickett, PA-C 02/17/22 2252

## 2022-02-18 ENCOUNTER — Emergency Department (HOSPITAL_COMMUNITY): Payer: Medicare HMO

## 2022-02-18 LAB — CBC WITH DIFFERENTIAL/PLATELET
Abs Immature Granulocytes: 0.01 10*3/uL (ref 0.00–0.07)
Basophils Absolute: 0.1 10*3/uL (ref 0.0–0.1)
Basophils Relative: 1 %
Eosinophils Absolute: 0.2 10*3/uL (ref 0.0–0.5)
Eosinophils Relative: 4 %
HCT: 38 % (ref 36.0–46.0)
Hemoglobin: 12.3 g/dL (ref 12.0–15.0)
Immature Granulocytes: 0 %
Lymphocytes Relative: 41 %
Lymphs Abs: 2.2 10*3/uL (ref 0.7–4.0)
MCH: 28 pg (ref 26.0–34.0)
MCHC: 32.4 g/dL (ref 30.0–36.0)
MCV: 86.4 fL (ref 80.0–100.0)
Monocytes Absolute: 0.6 10*3/uL (ref 0.1–1.0)
Monocytes Relative: 10 %
Neutro Abs: 2.4 10*3/uL (ref 1.7–7.7)
Neutrophils Relative %: 44 %
Platelets: 201 10*3/uL (ref 150–400)
RBC: 4.4 MIL/uL (ref 3.87–5.11)
RDW: 13.2 % (ref 11.5–15.5)
WBC: 5.4 10*3/uL (ref 4.0–10.5)
nRBC: 0 % (ref 0.0–0.2)

## 2022-02-18 LAB — RESP PANEL BY RT-PCR (FLU A&B, COVID) ARPGX2
Influenza A by PCR: NEGATIVE
Influenza B by PCR: NEGATIVE
SARS Coronavirus 2 by RT PCR: NEGATIVE

## 2022-02-18 LAB — TROPONIN I (HIGH SENSITIVITY): Troponin I (High Sensitivity): 4 ng/L (ref <18)

## 2022-02-18 NOTE — ED Notes (Signed)
Pt provided taxi voucher for ride home.

## 2022-02-18 NOTE — Discharge Instructions (Addendum)
Today you were seen in the emergency department for your head tingling, cough, and congestion.    In the emergency department you had a reassuring evaluation including a head CT, chest x-ray, COVID/flu, and lab work.  It is likely that your symptoms are from a viral upper respiratory tract infection which will resolve with time.  At home, please take Tylenol as needed for your headache.    Follow-up with your primary doctor in 2-3 days regarding your visit.  A referral for neurology has been placed to discuss your MS and treatment for this condition.  They will call you about an appointment.  Return immediately to the emergency department if you experience any of the following: Worsening headache, new numbness or weakness, or any other concerning symptoms.    Thank you for visiting our Emergency Department. It was a pleasure taking care of you today.

## 2022-02-18 NOTE — ED Provider Notes (Signed)
[facility] EMERGENCY DEPARTMENT Provider Note   CSN: [id number] Arrival date & time: 02/17/22  2240     History  Chief Complaint  Patient presents with   Palpitations    GUYLA BLESS is a 71 y.o. female.  71 year old female with a history of asthma, depression, hypertension, MS not currently on treatment and rheumatoid arthritis who presents with multiple complaints.  Initially the patient was complaining of palpitations but denies this to me.  Says that her primary complaint today is a burning sensation on the top of her head.  Says that this has been present since July.  Says that she also had a salty taste in her mouth several months ago but that it has resolved and she is not feeling it now.  Says that occasionally she will have numbness in her hands and feet.  Says that she has chronic dizziness that has been present since she was diagnosed with her MS which she is currently experiencing.  Denies any vision changes, eye pain, slurred speech.  No rashes or trauma.  Denies any chest pain, shortness of breath, fevers.  Says that she does not currently follow with a neurologist and wanted to be evaluated for her symptoms.   Also reports 2 days of cough, congestion, and sore throat.  No known sick contacts.  No fevers. No SOB or chest pain.        Home Medications Prior to Admission medications   Medication Sig Start Date End Date Taking? Authorizing Provider  atorvastatin (LIPITOR) 40 MG tablet Take 40 mg by mouth daily. 05/28/21   [provider]  cholecalciferol (VITAMIN D) 1000 UNITS tablet Take 1 tablet (1,000 Units total) by mouth daily. 09/18/13   Rey, Latina Craver, NP  Cyanocobalamin (B-12 PO) Take 1 tablet by mouth daily.    [provider]  escitalopram (LEXAPRO) 10 MG tablet Take 1 tablet (10 mg total) by mouth at bedtime. 09/18/13   Rey, Latina Craver, NP  ibuprofen (ADVIL) 800 MG tablet Take 800 mg by mouth every 6 (six) hours as needed for  moderate pain, mild pain, headache or fever. 07/24/21   [provider]  Multiple Vitamin (MULTIVITAMIN) tablet Take 1 tablet by mouth daily. Centrum.    [provider]  omeprazole (PRILOSEC) 20 MG capsule Take 20 mg by mouth daily. 04/08/21   [provider]  pregabalin (LYRICA) 50 MG capsule Take 1 capsule (50 mg total) by mouth 2 (two) times daily. 08/03/21 09/02/21  Dahal, Marlowe Aschoff, MD  sucralfate (CARAFATE) 1 g tablet Take 1 tablet (1 g total) by mouth with breakfast, with lunch, and with evening meal for 7 days. 01/04/22 01/11/22  Jeanell Sparrow, DO      Allergies    Gabapentin    Review of Systems   Review of Systems  Physical Exam Updated Vital Signs BP (!) 148/63   Pulse 63   Temp 98.9 F (37.2 C)   Resp 13   SpO2 100%  Physical Exam Vitals and nursing note reviewed.  Constitutional:      General: She is not in acute distress.    Appearance: She is well-developed.  HENT:     Head: Normocephalic and atraumatic.     Right Ear: External ear normal.     Left Ear: External ear normal.     Nose: Nose normal.     Mouth/Throat:     Mouth: Mucous membranes are moist.     Pharynx: Oropharynx is clear.  Eyes:     Extraocular Movements: Extraocular movements intact.     Conjunctiva/sclera: Conjunctivae normal.     Pupils: Pupils are equal, round, and reactive to light.  Cardiovascular:     Rate and Rhythm: Normal rate and regular rhythm.     Heart sounds: No murmur heard. Pulmonary:     Effort: Pulmonary effort is normal. No respiratory distress.     Breath sounds: Normal breath sounds. No wheezing, rhonchi or rales.  Abdominal:     General: Abdomen is flat. There is no distension.     Palpations: Abdomen is soft.  Musculoskeletal:        General: No swelling.     Cervical back: Normal range of motion and neck supple.     Right lower leg: No edema.     Left lower leg: No edema.  Skin:    General: Skin is warm and dry.     Capillary Refill:  Capillary refill takes less than 2 seconds.     Comments: No rash on scalp where burning reported  Neurological:     Mental Status: She is alert and oriented to person, place, and time. Mental status is at baseline.     Comments: MENTAL STATUS: AAOx3 CRANIAL NERVES: II: Pupils equal and reactive 3 mm BL, no RAPD, no VF deficits III, IV, VI: EOM intact, no gaze preference or deviation, no nystagmus. V: normal sensation to light touch in V1, V2, and V3 segments bilaterally VII: no facial weakness or asymmetry, no nasolabial fold flattening VIII: normal hearing to speech and finger friction IX, X: normal palatal elevation, no uvular deviation XI: 5/5 head turn and 5/5 shoulder shrug bilaterally XII: midline tongue protrusion MOTOR: 5/5 strength in R shoulder flexion, elbow flexion and extension, and grip strength. 5/5 strength in L shoulder flexion, elbow flexion and extension, and grip strength.  5/5 strength in R hip and knee flexion, knee extension, ankle plantar and dorsiflexion. 5/5 strength in L hip and knee flexion, knee extension, ankle plantar and dorsiflexion. SENSORY: Normal sensation to light touch in all extremities COORD: Normal finger to nose and heel to shin, no tremor, no dysmetria STATION: normal stance, no truncal ataxia GAIT: Difficulty ambulating (at baseline per patient who states she walks with cane/walker at baseline)   Psychiatric:        Mood and Affect: Mood normal.     ED Results / Procedures / Treatments   Labs (all labs ordered are listed, but only abnormal results are displayed) Labs Reviewed  BASIC METABOLIC PANEL - Abnormal; Notable for the following components:      Result Value   Glucose, Bld 115 (*)    All other components within normal limits  RESP PANEL BY RT-PCR (FLU A&B, COVID) [id number]  CBC WITH DIFFERENTIAL/PLATELET  TROPONIN I (HIGH SENSITIVITY)  TROPONIN I (HIGH SENSITIVITY)    EKG EKG Interpretation  Date/Time:  Tuesday February 17 2022 23:11:38 EDT Ventricular Rate:  70 PR Interval:  168 QRS Duration: 66 QT Interval:  392 QTC Calculation: 423 R Axis:   30 Text Interpretation: Normal sinus rhythm Cannot rule out Anterior infarct , age undetermined Abnormal ECG When compared with ECG of 04-Jan-2022 07:39, No significant change was found Confirmed by Delora Fuel ([id number]) on 02/18/2022 12:09:32 AM  Radiology CT Head Wo Contrast  Result Date: 02/18/2022 CLINICAL DATA:  71 year old female with history of new onset of headache. EXAM: CT HEAD WITHOUT CONTRAST TECHNIQUE: Contiguous axial images were obtained from the base of  the skull through the vertex without intravenous contrast. RADIATION DOSE REDUCTION: This exam was performed according to the departmental dose-optimization program which includes automated exposure control, adjustment of the mA and/or kV according to patient size and/or use of iterative reconstruction technique. COMPARISON:  Head CT 11/13/2017. FINDINGS: Brain: Patchy and confluent areas of decreased attenuation are noted throughout the deep and periventricular white matter of the cerebral hemispheres bilaterally, compatible with chronic microvascular ischemic disease. No evidence of acute infarction, hemorrhage, hydrocephalus, extra-axial collection or mass lesion/mass effect. Vascular: No hyperdense vessel or unexpected calcification. Skull: Normal. Negative for fracture or focal lesion. Sinuses/Orbits: No acute finding. Other: None. IMPRESSION: 1. No acute intracranial abnormalities. 2. Extensive chronic microvascular ischemic changes in the cerebral white matter, as above. Electronically Signed   By: Vinnie Langton M.D.   On: 02/18/2022 07:55   DG Chest 2 View  Result Date: 02/17/2022 CLINICAL DATA:  Flu-like symptoms EXAM: CHEST - 2 VIEW COMPARISON:  Radiographs 01/04/2022 FINDINGS: No focal consolidation, pleural effusion, or pneumothorax. Normal cardiomediastinal silhouette. No acute osseous abnormality.  IMPRESSION: No active cardiopulmonary disease. Electronically Signed   By: Placido Sou M.D.   On: 02/17/2022 23:10    Procedures Procedures   Medications Ordered in ED Medications - No data to display  ED Course/ Medical Decision Making/ A&P Clinical Course as of 02/18/22 0832  Wed Feb 18, 2022  0753 CXR and head CT reviewed and interpreted by me as showing no acute abnormality.  [RP]    Clinical Course User Index [RP] Fransico Meadow, MD                           Medical Decision Making Amount and/or Complexity of Data Reviewed Radiology: ordered.   SHEKERA BEAVERS is a 71 year old female with a history of asthma, depression, hypertension, MS not currently on treatment and rheumatoid arthritis who presents with multiple complaints including headache, patchy tingling, and URI symptoms  Initial Ddx:  MS, stroke, TIA, viral URI, pneumonia  MDM:  Feel the patient is likely having symptoms related to her MS.  Does not fit a distribution that would be concerning for stroke or TIA.  Her neuro exam today is at baseline and only deficit is difficulty with ambulation but does not have any truncal ataxia or nystagmus that would be concerning for a cerebellar process at this time.  Given her advanced age and headache will obtain a head CT.  Feel the patient likely also has a viral URI so we will test for COVID and flu and obtain chest x-ray to evaluate for pneumonia.  Plan:  Labs COVID/flu Chest x-ray CT head  ED Summary:  Patient underwent the above work-up which showed that her COVID and flu were negative.  Chest x-ray did not show evidence of pneumonia.  She was satting well on room air the entire time in the emergency department.  Head CT did not show any acute abnormalities.  We will have the patient follow-up with neurology regarding her MS as she does not have any new symptoms that would warrant admission or emergent treatment for a flare.  We will also have her follow-up with  her primary doctor regarding the rest of her symptoms.  Dispo: DC Home. Return precautions discussed including, but not limited to, those listed in the AVS. Allowed pt time to ask questions which were answered fully prior to dc.  Records reviewed Care Everywhere The following labs were independently interpreted:  Chemistry I independently visualized the following imaging with scope of interpretation limited to determining acute life threatening conditions related to emergency care: Chest x-ray and CT Head, which revealed no acute abnormality   Final Clinical Impression(s) / ED Diagnoses Final diagnoses:  Nonintractable headache, unspecified chronicity pattern, unspecified headache type  Viral URI  History of multiple sclerosis ([place])    Rx / DC Orders ED Discharge Orders          Ordered    Ambulatory referral to Neurology       Comments: An appointment is requested in approximately: 2 weeks for MS   02/18/22 0821              Fransico Meadow, MD 02/18/22 [phone]

## 2022-07-27 ENCOUNTER — Ambulatory Visit: Payer: Medicare HMO

## 2022-10-13 IMAGING — CR DG CHEST 2V
2 series · 2 of 2 positions shown · non-contrast
Comparison: Chest x-ray 03/10/2021.  CT chest 01/22/2018.

CLINICAL DATA: Chest pain.

EXAM:
CHEST - 2 VIEW

[chest pa]
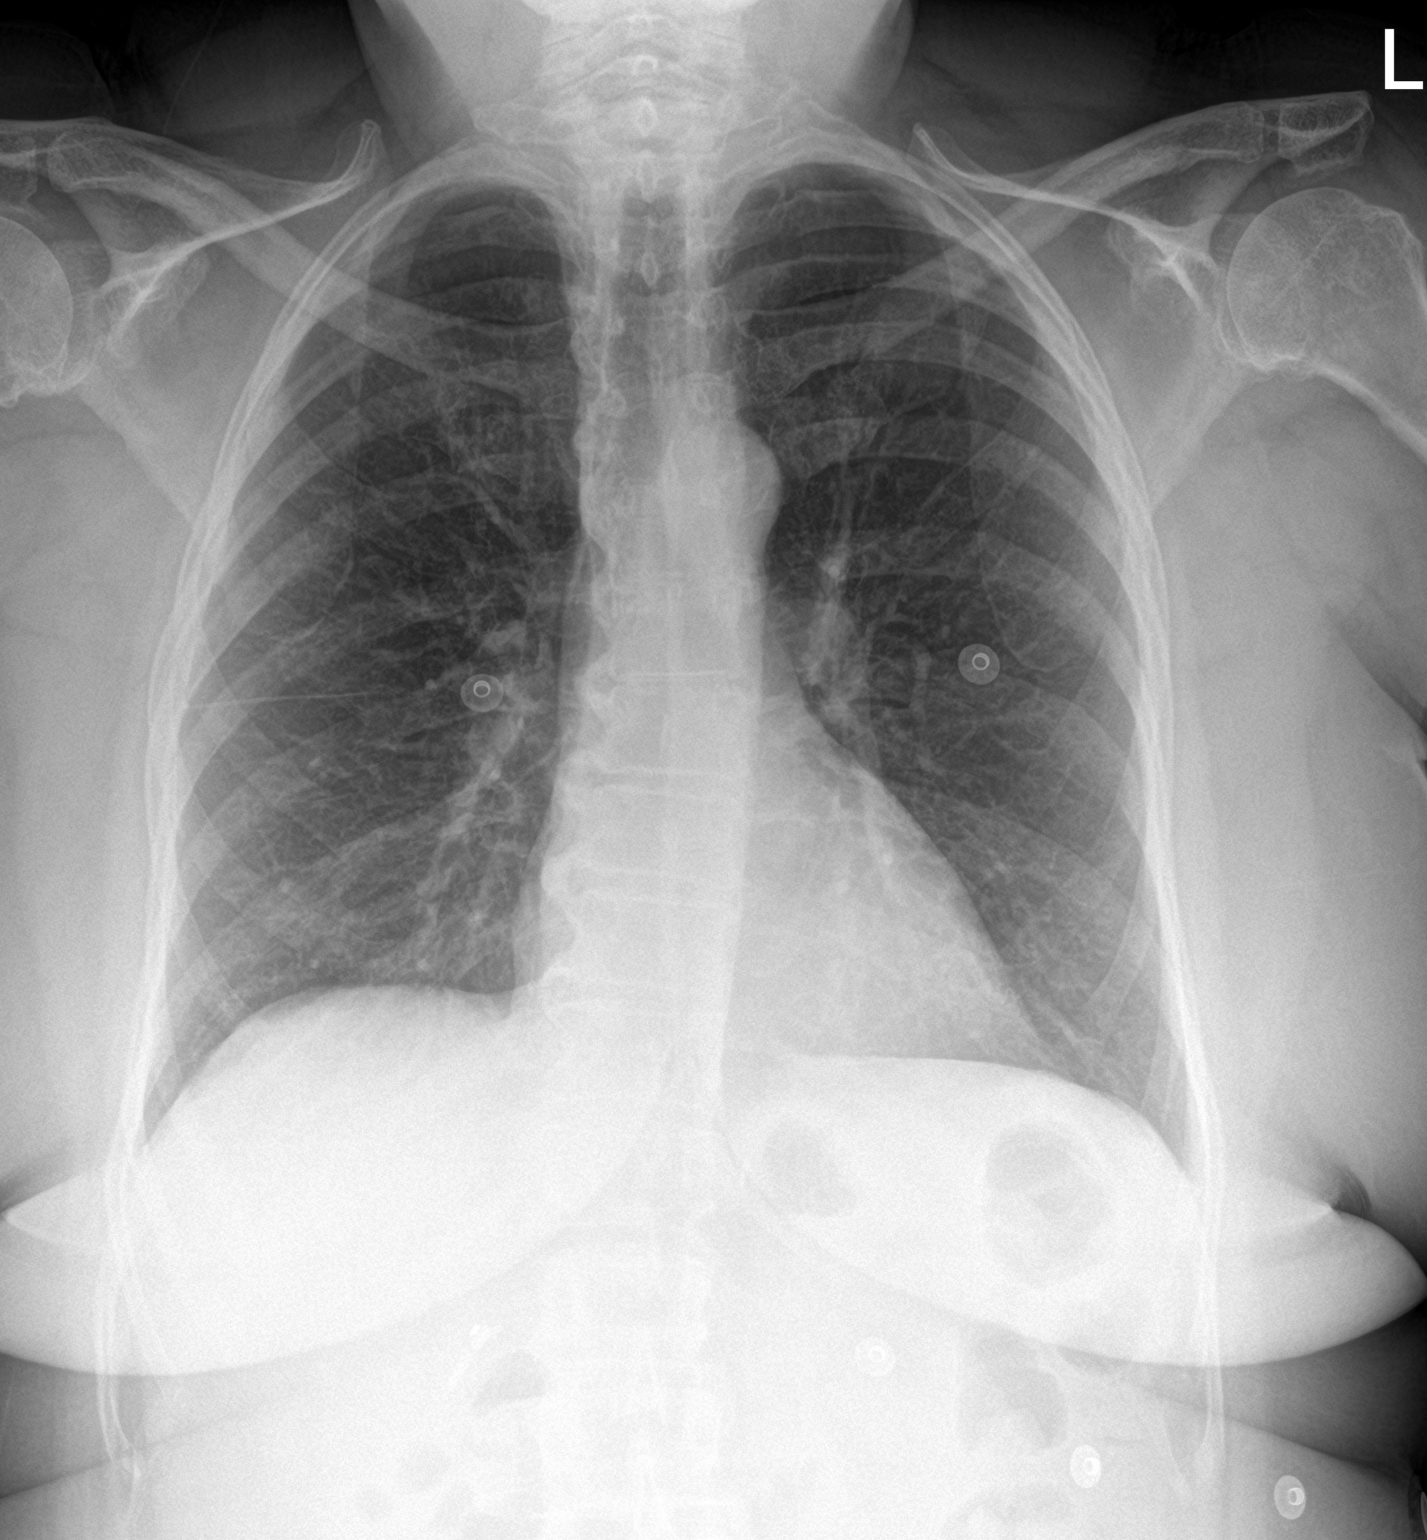

[chest lat]
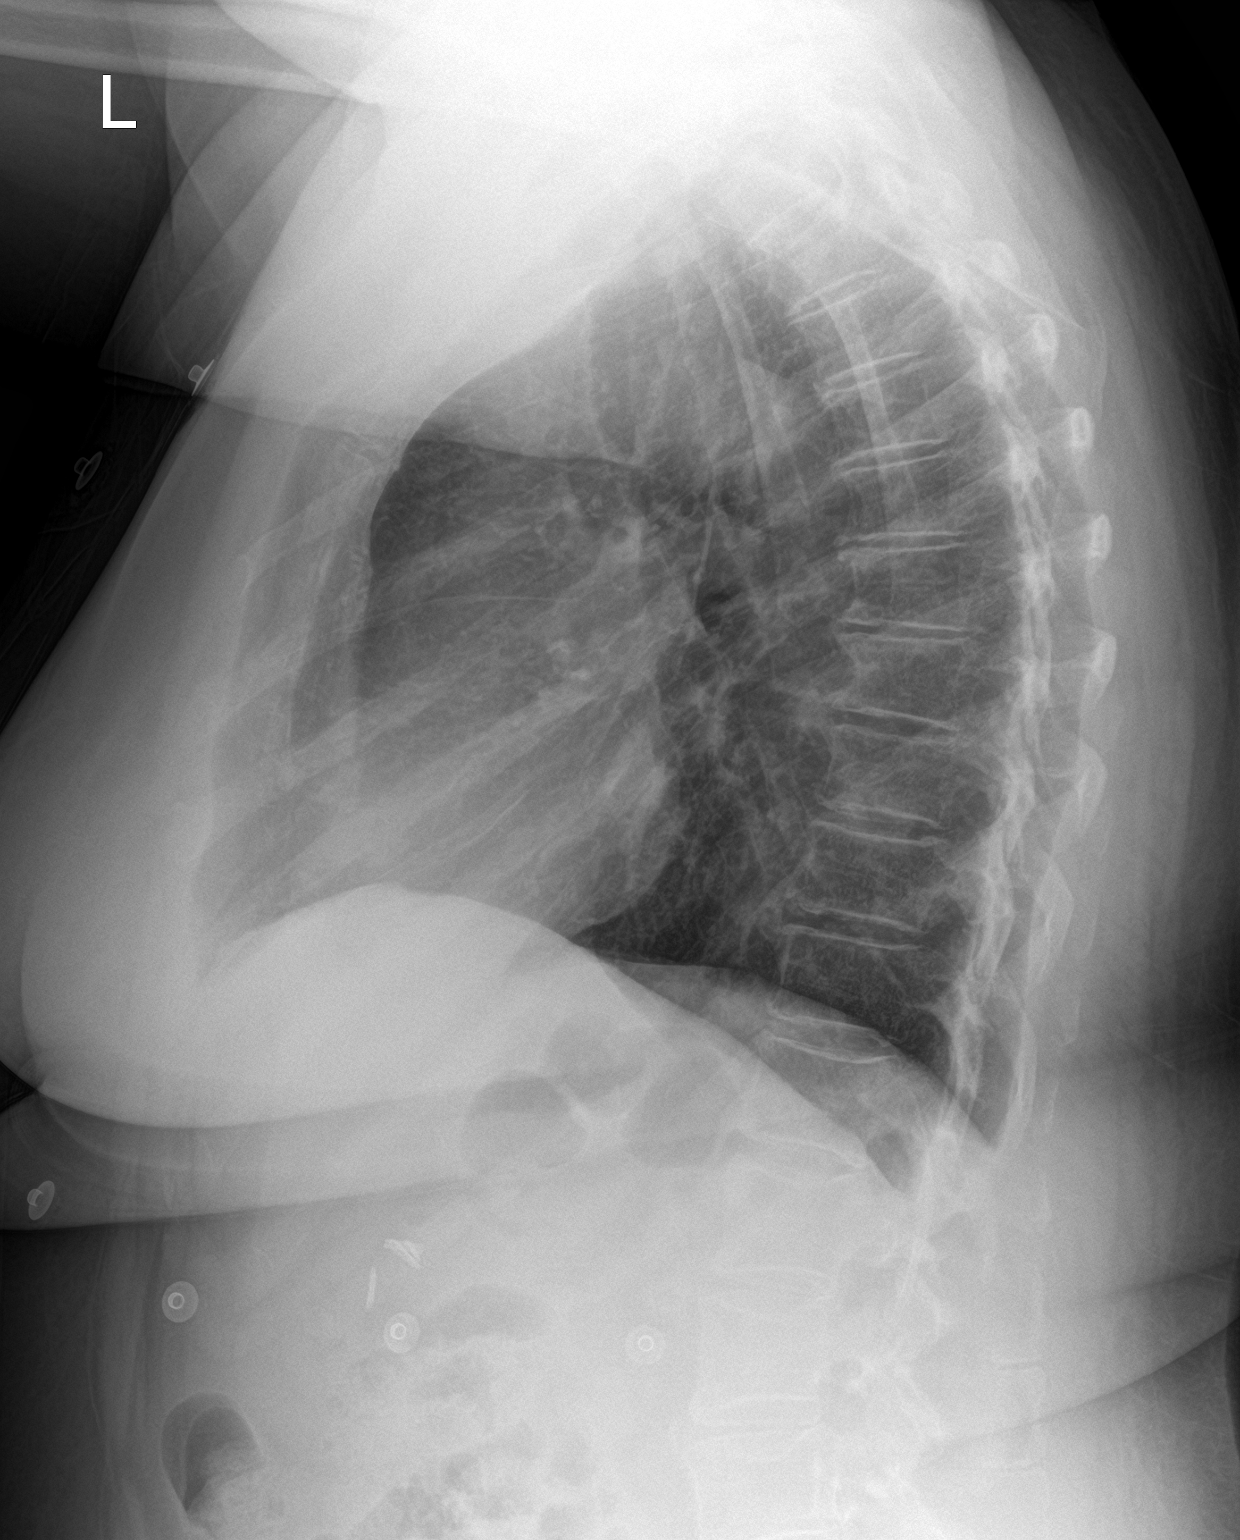

[2 of 2 positions shown; findings below may reference images not displayed]

FINDINGS: The heart size and mediastinal contours are within normal limits.
There is a questionable small nodular density in the right lower
lung. This was not seen on prior CT. The lungs are otherwise clear.
The visualized skeletal structures are unremarkable.
IMPRESSION: 1.  No active cardiopulmonary disease.

2. Questionable small nodular density in the right lower lung.
Recommend follow-up nonemergent chest CT.

## 2023-03-11 DIAGNOSIS — Z1211 Encounter for screening for malignant neoplasm of colon: Secondary | ICD-10-CM | POA: Diagnosis not present

## 2023-03-11 DIAGNOSIS — Z131 Encounter for screening for diabetes mellitus: Secondary | ICD-10-CM | POA: Diagnosis not present

## 2023-03-11 DIAGNOSIS — Z23 Encounter for immunization: Secondary | ICD-10-CM | POA: Diagnosis not present

## 2023-03-11 DIAGNOSIS — E7849 Other hyperlipidemia: Secondary | ICD-10-CM | POA: Diagnosis not present

## 2023-03-11 DIAGNOSIS — J302 Other seasonal allergic rhinitis: Secondary | ICD-10-CM | POA: Diagnosis not present

## 2023-03-11 DIAGNOSIS — G35 Multiple sclerosis: Secondary | ICD-10-CM | POA: Diagnosis not present

## 2023-03-11 DIAGNOSIS — Z0000 Encounter for general adult medical examination without abnormal findings: Secondary | ICD-10-CM | POA: Diagnosis not present

## 2023-03-11 DIAGNOSIS — J4520 Mild intermittent asthma, uncomplicated: Secondary | ICD-10-CM | POA: Diagnosis not present

## 2023-03-11 DIAGNOSIS — M179 Osteoarthritis of knee, unspecified: Secondary | ICD-10-CM | POA: Diagnosis not present

## 2023-03-11 DIAGNOSIS — Z1239 Encounter for other screening for malignant neoplasm of breast: Secondary | ICD-10-CM | POA: Diagnosis not present

## 2023-11-19 ENCOUNTER — Observation Stay (HOSPITAL_COMMUNITY)

## 2023-11-19 ENCOUNTER — Emergency Department (HOSPITAL_COMMUNITY)

## 2023-11-19 ENCOUNTER — Encounter (HOSPITAL_COMMUNITY): Payer: Self-pay

## 2023-11-19 ENCOUNTER — Other Ambulatory Visit (HOSPITAL_COMMUNITY): Payer: Self-pay

## 2023-11-19 ENCOUNTER — Observation Stay (HOSPITAL_COMMUNITY)
Admission: EM | Admit: 2023-11-19 | Discharge: 2023-11-19 | Disposition: A | Discharge status: Home / Self Care | Attending: Internal Medicine | Admitting: Internal Medicine

## 2023-11-19 ENCOUNTER — Other Ambulatory Visit: Payer: Self-pay

## 2023-11-19 DIAGNOSIS — Z79899 Other long term (current) drug therapy: Secondary | ICD-10-CM | POA: Diagnosis not present

## 2023-11-19 DIAGNOSIS — R0602 Shortness of breath: Secondary | ICD-10-CM

## 2023-11-19 DIAGNOSIS — Z7901 Long term (current) use of anticoagulants: Secondary | ICD-10-CM | POA: Insufficient documentation

## 2023-11-19 DIAGNOSIS — J45909 Unspecified asthma, uncomplicated: Secondary | ICD-10-CM | POA: Diagnosis not present

## 2023-11-19 DIAGNOSIS — I10 Essential (primary) hypertension: Secondary | ICD-10-CM | POA: Insufficient documentation

## 2023-11-19 DIAGNOSIS — R531 Weakness: Secondary | ICD-10-CM | POA: Diagnosis not present

## 2023-11-19 DIAGNOSIS — R4701 Aphasia: Secondary | ICD-10-CM | POA: Diagnosis not present

## 2023-11-19 LAB — DIFFERENTIAL
Abs Immature Granulocytes: 0 10*3/uL (ref 0.00–0.07)
Basophils Absolute: 0 10*3/uL (ref 0.0–0.1)
Basophils Relative: 1 %
Eosinophils Absolute: 0.2 10*3/uL (ref 0.0–0.5)
Eosinophils Relative: 3 %
Immature Granulocytes: 0 %
Lymphocytes Relative: 36 %
Lymphs Abs: 2.2 10*3/uL (ref 0.7–4.0)
Monocytes Absolute: 0.7 10*3/uL (ref 0.1–1.0)
Monocytes Relative: 11 %
Neutro Abs: 3.1 10*3/uL (ref 1.7–7.7)
Neutrophils Relative %: 49 %

## 2023-11-19 LAB — CBC
HCT: 40.2 % (ref 36.0–46.0)
Hemoglobin: 12.4 g/dL (ref 12.0–15.0)
MCH: 27.6 pg (ref 26.0–34.0)
MCHC: 30.8 g/dL (ref 30.0–36.0)
MCV: 89.3 fL (ref 80.0–100.0)
Platelets: 183 10*3/uL (ref 150–400)
RBC: 4.5 MIL/uL (ref 3.87–5.11)
RDW: 13.4 % (ref 11.5–15.5)
WBC: 6.1 10*3/uL (ref 4.0–10.5)
nRBC: 0 % (ref 0.0–0.2)

## 2023-11-19 LAB — APTT: aPTT: 25 s (ref 24–36)

## 2023-11-19 LAB — BRAIN NATRIURETIC PEPTIDE: B Natriuretic Peptide: 26.7 pg/mL (ref 0.0–100.0)

## 2023-11-19 LAB — RAPID URINE DRUG SCREEN, HOSP PERFORMED
Amphetamines: NOT DETECTED
Barbiturates: NOT DETECTED
Benzodiazepines: NOT DETECTED
Cocaine: NOT DETECTED
Opiates: NOT DETECTED
Tetrahydrocannabinol: NOT DETECTED

## 2023-11-19 LAB — COMPREHENSIVE METABOLIC PANEL WITH GFR
ALT: 25 U/L (ref 0–44)
AST: 24 U/L (ref 15–41)
Albumin: 3.6 g/dL (ref 3.5–5.0)
Alkaline Phosphatase: 79 U/L (ref 38–126)
Anion gap: 8 (ref 5–15)
BUN: 17 mg/dL (ref 8–23)
CO2: 23 mmol/L (ref 22–32)
Calcium: 8.9 mg/dL (ref 8.9–10.3)
Chloride: 110 mmol/L (ref 98–111)
Creatinine, Ser: 1.22 mg/dL — ABNORMAL HIGH (ref 0.44–1.00)
GFR, Estimated: 47 mL/min — ABNORMAL LOW (ref >60)
Glucose, Bld: 103 mg/dL — ABNORMAL HIGH (ref 70–99)
Potassium: 3.3 mmol/L — ABNORMAL LOW (ref 3.5–5.1)
Sodium: 141 mmol/L (ref 135–145)
Total Bilirubin: 0.5 mg/dL (ref 0.0–1.2)
Total Protein: 7.2 g/dL (ref 6.5–8.1)

## 2023-11-19 LAB — BLOOD GAS, VENOUS
Acid-base deficit: 0.4 mmol/L (ref 0.0–2.0)
Bicarbonate: 25.9 mmol/L (ref 20.0–28.0)
O2 Saturation: 59.4 %
Patient temperature: 37
pCO2, Ven: 48 mmHg (ref 44–60)
pH, Ven: 7.34 (ref 7.25–7.43)
pO2, Ven: 37 mmHg (ref 32–45)

## 2023-11-19 LAB — I-STAT CHEM 8, ED
BUN: 16 mg/dL (ref 8–23)
Calcium, Ion: 1.24 mmol/L (ref 1.15–1.40)
Chloride: 110 mmol/L (ref 98–111)
Creatinine, Ser: 1.3 mg/dL — ABNORMAL HIGH (ref 0.44–1.00)
Glucose, Bld: 102 mg/dL — ABNORMAL HIGH (ref 70–99)
HCT: 37 % (ref 36.0–46.0)
Hemoglobin: 12.6 g/dL (ref 12.0–15.0)
Potassium: 3.4 mmol/L — ABNORMAL LOW (ref 3.5–5.1)
Sodium: 145 mmol/L (ref 135–145)
TCO2: 24 mmol/L (ref 22–32)

## 2023-11-19 LAB — MAGNESIUM: Magnesium: 2 mg/dL (ref 1.7–2.4)

## 2023-11-19 LAB — PHOSPHORUS: Phosphorus: 2.7 mg/dL (ref 2.5–4.6)

## 2023-11-19 LAB — PROCALCITONIN: Procalcitonin: <0.1 ng/mL

## 2023-11-19 LAB — ETHANOL: Alcohol, Ethyl (B): <15 mg/dL (ref <15)

## 2023-11-19 LAB — PROTIME-INR
INR: 1 (ref 0.8–1.2)
Prothrombin Time: 13.6 s (ref 11.4–15.2)

## 2023-11-19 MED ORDER — ACETAMINOPHEN 325 MG PO TABS: 650.0000 mg | ORAL_TABLET | Freq: Four times a day (QID) | ORAL | Status: DC | PRN

## 2023-11-19 MED ORDER — ASPIRIN 81 MG PO TBEC
81.0000 mg | DELAYED_RELEASE_TABLET | Freq: Every day | ORAL | 0 refills | Status: AC
  Filled 2023-11-19: qty 90, 90d supply, fill #0

## 2023-11-19 MED ORDER — ALBUTEROL SULFATE (2.5 MG/3ML) 0.083% IN NEBU: 2.5000 mg | INHALATION_SOLUTION | RESPIRATORY_TRACT | Status: DC | PRN

## 2023-11-19 MED ORDER — IOHEXOL 350 MG/ML SOLN
100.0000 mL | Freq: Once | INTRAVENOUS | Status: AC | PRN
  Administered 2023-11-19: 100 mL via INTRAVENOUS

## 2023-11-19 MED ORDER — ACETAMINOPHEN 650 MG RE SUPP: 650.0000 mg | Freq: Four times a day (QID) | RECTAL | Status: DC | PRN

## 2023-11-19 MED ORDER — ASPIRIN 81 MG PO CHEW
324.0000 mg | CHEWABLE_TABLET | Freq: Once | ORAL | Status: DC
Start: 2023-11-19 — End: 2023-11-19

## 2023-11-19 MED ORDER — ASPIRIN 81 MG PO CHEW
81.0000 mg | CHEWABLE_TABLET | Freq: Once | ORAL | Status: AC
  Administered 2023-11-19: 81 mg via ORAL
  Filled 2023-11-19: qty 1

## 2023-11-19 NOTE — ED Provider Notes (Signed)
 [facility]-EMERGENCY DEPT [facility] Emergency Department Provider Note MRN:  [id number]  Arrival date & time: 11/19/23     Chief Complaint   Shortness of Breath   History of Present Illness   Deborah Reid is a 73 y.o. year-old female presents to the ED with chief complaint of shortness of breath.  Was brought in by EMS after being called out for shortness of breath.  Also had reported high blood pressure and lower extremity swelling.  On my exam, patient was unable to speak, she had pressured speech and expressive aphasia.  Code stroke was activated. LKW 8pm.     Review of Systems  Pertinent positive and negative review of systems noted in HPI.    Physical Exam   Vitals:   11/19/23 0109 11/19/23 0145  BP: (!) 184/75 (!) 173/70  Pulse: 70 69  Resp: 13 11  Temp: 98.6 F (37 C)   SpO2: 100% 100%    CONSTITUTIONAL: Nontoxic-appearing, NAD NEURO:  Alert and oriented x 3, CN 3-12 grossly intact, pressured speech EYES:  eyes equal and reactive ENT/NECK:  Supple, no stridor  CARDIO:  normal rate, regular rhythm, appears well-perfused  PULM:  No respiratory distress, CTAB GI/GU:  non-distended,  MSK/SPINE:  No gross deformities, trace edema, moves all extremities  SKIN:  no rash, atraumatic   *Additional and/or pertinent findings included in MDM below  Diagnostic and Interventional Summary    EKG Interpretation Date/Time:  Friday November 19 2023 01:07:52 EDT Ventricular Rate:  69 PR Interval:  184 QRS Duration:  101 QT Interval:  402 QTC Calculation: 431 R Axis:   15  Text Interpretation: Sinus rhythm Low voltage, precordial leads No significant change was found Confirmed by Carita Senior [id number]) on 11/19/2023 1:45:49 AM       Labs Reviewed  COMPREHENSIVE METABOLIC PANEL WITH GFR - Abnormal; Notable for the following components:      Result Value   Potassium 3.3 (*)    Glucose, Bld 103 (*)    Creatinine, Ser 1.22 (*)    GFR, Estimated 47 (*)    All other  components within normal limits  I-STAT CHEM 8, ED - Abnormal; Notable for the following components:   Potassium 3.4 (*)    Creatinine, Ser 1.30 (*)    Glucose, Bld 102 (*)    All other components within normal limits  ETHANOL  PROTIME-INR  APTT  CBC  DIFFERENTIAL  RAPID URINE DRUG SCREEN, HOSP PERFORMED  BRAIN NATRIURETIC PEPTIDE  BLOOD GAS, VENOUS  PROCALCITONIN  MAGNESIUM   PHOSPHORUS    CT ANGIO HEAD NECK W WO CM W PERF (CODE STROKE)  Final Result    DG Chest Portable 1 View  Final Result    CT HEAD CODE STROKE WO CONTRAST  Final Result    MR BRAIN WO CONTRAST    (Results Pending)    Medications  aspirin  chewable tablet 81 mg (has no administration in time range)  acetaminophen  (TYLENOL ) tablet 650 mg (has no administration in time range)    Or  acetaminophen  (TYLENOL ) suppository 650 mg (has no administration in time range)  albuterol  (PROVENTIL ) (2.5 MG/3ML) 0.083% nebulizer solution 2.5 mg (has no administration in time range)  iohexol  (OMNIPAQUE ) 350 MG/ML injection 100 mL (100 mLs Intravenous Contrast Given 11/19/23 0310)     Procedures  /  Critical Care Procedures  ED Course and Medical Decision Making  I have reviewed the triage vital signs, the nursing notes, and pertinent available records from the EMR.  Social Determinants Affecting Complexity of Care: Patient has no clinically significant social determinants affecting this chief complaint..   ED Course:    Medical Decision Making Patient brought in by EMS with shortness of breath.  On my exam, the patient was unable to communicate and had pressured and stuttered speech.  Patient seems frustrated that she is not able to get her words out.  She does not have any strength deficits or sensation deficits on my exam, but is unable to speak clearly.  She is able to communicate that this is new for her and that the symptoms started around 8 PM.  I immediately asked Dr. Carita to see the patient with me, and  he agreed with activating patient as a code stroke.  The patient was seen by Dr. Arby, with teleneurology.  CT head was negative.  CT perfusion study is pending.  Teleneurology recommends MRI and med reconciliation and starting baby dose aspirin .  If CTs and MRI is negative, then consider psychiatric consultation for conversion disorder.  I spoke with the patient's daughter, who states that patient had been feeling short of breath tonight and was not communicating well.  She does have some trace edema in her lower extremities, but chest x-ray is not consistent with volume overload.  Labs are fairly reassuring thus far.  We do not have access to MRI at this time.  Feel that patient would be most benefited from admission in the hospital for further stroke rule out and evaluation of her shortness of breath plus or minus psychiatry consultation  Amount and/or Complexity of Data Reviewed Labs: ordered. Radiology: ordered.  Risk OTC drugs. Prescription drug management. Decision regarding hospitalization.         Consultants: I consulted with Dr. Arby, who recommends CTA perfusion.  Out of TNK window.  Suspected conversion disorder, but will need MRI and psych consult if CTA and MRI negative..  I consulted with Dr. Marcene, who is appreciated for admitting.  Treatment and Plan: Patient's exam and diagnostic results are concerning for expressive aphasia.  Feel that patient will need admission to the hospital for further treatment and evaluation.    Final Clinical Impressions(s) / ED Diagnoses     ICD-10-CM   1. Expressive aphasia  R47.01     2. SOB (shortness of breath)  R06.02       ED Discharge Orders     None         Discharge Instructions Discussed with and Provided to Patient:   Discharge Instructions   None      Vicky Charleston, PA-C 11/19/23 0441    Carita Senior, MD 11/20/23 [phone]

## 2023-11-19 NOTE — Progress Notes (Signed)
  Carryover admission to the Day Admitter.  I discussed this case with the EDP, Lamar Schlossman, PA.  Per these discussions:   This is a 73 year old female with history of  mild intermittent asthma, who is being admitted for further evaluation of expressive aphasia as well as complaining of some shortness of breath.  Expressive aphasia is reported to have occurred starting around 2330 on 11/18/2023, per patient's daughter, with whom EDP discussed patient's case.  Additionally, the patient is complaining of shortness of breath with some lower extremity edema over the last day, without any reported associated chest pain.  CT head negative. patient was evaluated by tele neurologist, who felt that pt's expressive aphasia was less likely to represent TIA or stroke but rather is more suspicious for conversion disorder; neurology recommended CTA head and neck with perfusion study, which has been completed and is awaiting formal radiology read, and also recommended MRI brain, which has been ordered; if this imaging is negative for acute process, neurology recommends consideration for psych consult for further evaluation of potential conversion disorder; tele-neurology did not feel there to be an indication for tPA.   Vital signs notable for afebrile , heart rates in the 60s to 70s; respirations 13-14, and oxygen  saturation 100% on room air.  Chest x-ray is reported to show no evidence of acute cardiopulmonary process.  EDP felt that there was only trace edema in the lower extremities.  BNP was not elevated.  No evidence of leukocytosis on presenting CBC.  And EKG without any reported acute ischemic changes.  I have placed an order for observation to med/tele it was along for further evaluation management of the above.  I have placed some additional preliminary admit orders via the adult multi-morbid admission order set. I have also ordered you for our neurochecks along with nursing bedside swallow screen.   Regarding patient's shortness of breath, I have ordered VBG, procalcitonin level, magnesium  and phosphorus levels as well as prn albuterol  nebulizer.    Eva Pore, DO Hospitalist

## 2023-11-19 NOTE — ED Notes (Signed)
 Called daughter Fidencio, to be ready for patient arrival at home

## 2023-11-19 NOTE — ED Notes (Signed)
 Given patient ham sandwich and water

## 2023-11-19 NOTE — ED Notes (Signed)
 Telestroke cart to bedside, currently speaking with nurse Powell

## 2023-11-19 NOTE — ED Notes (Signed)
 Pt assisted to bedside commode

## 2023-11-19 NOTE — ED Triage Notes (Signed)
 PT BIB [facility]. C/c of shob x 1.5 hours. PT was feeling very weak and lethargic. EMS stated the PT had tachypnea. PT had diminished lung sound bilaterally. PT has new onset of edema to the lower extremities bilaterally.

## 2023-11-19 NOTE — Consult Note (Signed)
 Consult note  Deborah Reid FMW:[id number] DOB: [birth year] DOA: 11/19/2023 PCP: Campbell Reynolds, NP  Presented from: Home Chief Complaint: Shortness of breath  History of Present Illness: Deborah Reid is a 73 y.o. female with PMH significant for HTN, asthma, multiple sclerosis, rheumatoid arthritis, GERD, hep C, depression, Last night, patient was brought to the ED by EMS for shortness of breath, weakness, bilateral lower extremity edema as well as an episode of expressive aphasia just prior to presentation.  In the ED, patient was afebrile, blood pressure was elevated to 180s Patient was noted to be aphasic.  Code stroke was activated. Seen by teleneurology CT head negative. CT angio head and neck was negative for LVO or significant stenosis.  It showed diffuse tortuosity of the major arterial vasculature of the head and neck, suggesting chronic underlying hypertension. MRI brain showed progression of moderate periventricular and scattered subcortical white matter T2 signal changes since 2019, extending into the brain stem bilaterally.  Patient was evaluated by tele neurologist, who felt that pt's expressive aphasia was less likely to represent TIA or stroke but rather is more suspicious for conversion disorder. Was started on baby aspirin   Labs with normal CBC, procalcitonin level not elevated, potassium low at 3.3, BUN/creatinine 17/1.22, blood glucose level 3 Urine drug screen unremarkable Chest x-ray unremarkable  At the time of my evaluation, patient was propped up in bed.  Not in distress.  Able to have a meaningful conversation.  Alert, awake, oriented x 3.  She did not have any residual aphasia.  No focal motor or sensory deficit. She was able to ambulate to the commode and out of the hall with nurses.  I called and spoke to her daughter on the phone.  All concerns addressed.  Patient daughter are both agreeable for discharge to home today.  Review of Systems:  All systems  were reviewed and were negative unless otherwise mentioned in the HPI   Past medical history: Past Medical History:  Diagnosis Date   Asthma    Depression    GERD (gastroesophageal reflux disease)    Glaucoma    Hepatitis C    Hypertension    Multiple sclerosis (HCC)    Rheumatoid arthritis (HCC)     Past surgical history: Past Surgical History:  Procedure Laterality Date   CHOLECYSTECTOMY     MASS EXCISION N/A 08/01/2021   Procedure: EXCISION AND DRAINAGE OF FACIAL ABCES;  Surgeon: Helga Cameron PARAS, DMD;  Location: [facility] OR;  Service: Dentistry;  Laterality: N/A;   TOOTH EXTRACTION N/A 08/01/2021   Procedure: DENTAL RESTORATION/EXTRACTIONS;  Surgeon: Helga Cameron PARAS, DMD;  Location: [facility] OR;  Service: Dentistry;  Laterality: N/A;    Social History:  reports that she quit smoking about 50 years ago. Her smoking use included cigarettes. She has never used smokeless tobacco. She reports that she does not drink alcohol and does not use drugs.  Allergies:  Allergies  Allergen Reactions   Gabapentin  Other (See Comments)    Patient feels hot in different areas of her body from head to toe   Gabapentin    Family history:  Family History  Problem Relation Age of Onset   Arthritis Mother    Depression Mother    Alcohol abuse Father    Arthritis Father    Transient ischemic attack Sister    Cancer Brother        prostate cancer   Arthritis Sister    Lupus Sister    Hypertension Sister  Heart disease Maternal Grandmother    Hypertension Maternal Grandmother    Diabetes Maternal Grandfather    Hypertension Paternal Grandmother      Physical Exam: Vitals:   11/19/23 0145 11/19/23 0534 11/19/23 0730 11/19/23 0931  BP: (!) 173/70  (!) 158/85   Pulse: 69  61   Resp: 11  15   Temp:  97.9 F (36.6 C)  97.7 F (36.5 C)  TempSrc:  Oral  Oral  SpO2: 100%  100%    Wt Readings from Last 3 Encounters:  01/04/22 77.1 kg  11/09/21 79.4 kg  07/31/21 84.7 kg   There is no  height or weight on file to calculate BMI.  General exam: Pleasant, elderly African-American female.  Not in distress Skin: No rashes, lesions or ulcers. HEENT: Atraumatic, normocephalic, no obvious bleeding Lungs: Clear to auscultation bilaterally,  CVS: S1, S2, no murmur,   GI/Abd: Soft, nontender, nondistended, bowel sound present,   CNS: Alert, awake, oriented x 3.  No aphasia.  No focal deficit Psychiatry: Mood appropriate,  Extremities: No pedal edema, no calf tenderness,    ----------------------------------------------------------------------------------------------------------------------------------------- ----------------------------------------------------------------------------------------------------------------------------------------- -----------------------------------------------------------------------------------------------------------------------------------------  Assessment/Plan: Principal Problem:   Expressive aphasia  Expressive aphasia Seen by teleneurology. CT head, CTA head and neck and MRI brain as above with no acute findings. Per teleneurology, her symptoms are likely conversion disorder than real cerebrovascular event. In light of her risk factors, aspirin  81 mg daily was recommended.  I have advised this to patient and family. As of this morning, she does not have any residual aphasia or new deficit.  Weakness There is a mention of shortness of breath, weakness and pedal edema on presentation. I did not see any pedal edema on my exam today. Patient was able to ambulate on the hallway today without supplemental oxygen .  I do not see any need of inpatient hospitalization.  She can be discharged to home today.   Discussed with patient's daughter.  Diet:  Diet Order             Diet regular Room service appropriate? Yes; Fluid consistency: Thin  Diet effective now           Diet general                   Nutritional status:  There is  no height or weight on file to calculate BMI.       Wounds:  - Incision (Closed) 08/01/21 Lip Left (Active)  Date First Assessed/Time First Assessed: 08/01/21 1422   Location: Lip  Location Orientation: Left    Assessments 08/01/2021  2:43 PM 08/03/2021  9:11 AM  Dressing Type Gauze (Comment) Gauze (Comment)  Dressing Clean, Dry, Intact --  Drainage Amount None --  Treatment Ice applied --     No associated orders.    Discharge Exam:   Vitals:   11/19/23 0145 11/19/23 0534 11/19/23 0730 11/19/23 0931  BP: (!) 173/70  (!) 158/85   Pulse: 69  61   Resp: 11  15   Temp:  97.9 F (36.6 C)  97.7 F (36.5 C)  TempSrc:  Oral  Oral  SpO2: 100%  100%     There is no height or weight on file to calculate BMI.  General exam: Pleasant, elderly African-American female Skin: No rashes, lesions or ulcers. HEENT: Atraumatic, normocephalic, no obvious bleeding Lungs: Clear to auscultation bilaterally,  CVS: S1, S2, no murmur,   GI/Abd: Soft, nontender, nondistended, bowel sound present,   CNS: Alert, awake,  oriented x 3.  No aphasia.  No focal deficit Psychiatry: Mood appropriate,  Extremities: No pedal edema, no calf tenderness,   Follow ups:    Follow-up Information     Campbell Reynolds, NP Follow up.   Contact information: [address] [place] [address] [phone]                 Discharge Instructions:   Discharge Instructions     Call MD for:  difficulty breathing, headache or visual disturbances   Complete by: As directed    Call MD for:  extreme fatigue   Complete by: As directed    Call MD for:  hives   Complete by: As directed    Call MD for:  persistant dizziness or light-headedness   Complete by: As directed    Call MD for:  persistant nausea and vomiting   Complete by: As directed    Call MD for:  severe uncontrolled pain   Complete by: As directed    Call MD for:  temperature >100.4   Complete by: As directed    Diet general   Complete  by: As directed    Discharge instructions   Complete by: As directed    You have been started on aspirin  81 mg daily.  General discharge instructions: Follow with Primary MD Campbell Reynolds, NP in 7 days  Please request your PCP  to go over your hospital tests, procedures, radiology results at the follow up. Please get your medicines reviewed and adjusted.  Your PCP may decide to repeat certain labs or tests as needed. Do not drive, operate heavy machinery, perform activities at heights, swimming or participation in water activities or provide baby sitting services if your were admitted for syncope or siezures until you have seen by Primary MD or a Neurologist and advised to do so again. [place]  Controlled Substance Reporting System database was reviewed. Do not drive, operate heavy machinery, perform activities at heights, swim, participate in water activities or provide baby-sitting services while on medications for pain, sleep and mood until your outpatient physician has reevaluated you and advised to do so again.  You are strongly recommended to comply with the dose, frequency and duration of prescribed medications. Activity: As tolerated with Full fall precautions use walker/cane & assistance as needed Avoid using any recreational substances like cigarette, tobacco, alcohol, or non-prescribed drug. If you experience worsening of your admission symptoms, develop shortness of breath, life threatening emergency, suicidal or homicidal thoughts you must seek medical attention immediately by calling 911 or calling your MD immediately  if symptoms less severe. You must read complete instructions/literature along with all the possible adverse reactions/side effects for all the medicines you take and that have been prescribed to you. Take any new medicine only after you have completely understood and accepted all the possible adverse reactions/side effects.  Wear Seat belts while driving. You were  cared for by a hospitalist during your hospital stay. If you have any questions about your discharge medications or the care you received while you were in the hospital after you are discharged, you can call the unit and ask to speak with the hospitalist or the covering physician. Once you are discharged, your primary care physician will handle any further medical issues. Please note that NO REFILLS for any discharge medications will be authorized once you are discharged, as it is imperative that you return to your primary care physician (or establish a relationship with a primary care physician  if you do not have one).   Increase activity slowly   Complete by: As directed        Discharge Medications:   Allergies as of 11/19/2023       Reactions   Gabapentin  Other (See Comments)   Patient feels hot in different areas of her body from head to toe        Medication List     TAKE these medications    aspirin  EC 81 MG tablet Take 1 tablet (81 mg total) by mouth daily. Swallow whole.   B-12 PO Take 1 tablet by mouth daily.   cholecalciferol  1000 units tablet Commonly known as: VITAMIN D  Take 1 tablet (1,000 Units total) by mouth daily.   multivitamin tablet Take 1 tablet by mouth daily. Centrum.   omega-3 acid ethyl esters 1 g capsule Commonly known as: LOVAZA Take 1 g by mouth daily.         The results of significant diagnostics from this hospitalization (including imaging, microbiology, ancillary and laboratory) are listed below for reference.    Procedures and Diagnostic Studies:   MR BRAIN WO CONTRAST Result Date: 11/19/2023 EXAM: MRI BRAIN WITHOUT CONTRAST 11/19/2023 06:52:58 AM TECHNIQUE: Multiplanar multisequence MRI of the head/brain was performed without the administration of intravenous contrast. COMPARISON: CT head without contrast 11/19/2023. MR head without contrast 11/12/2017. CLINICAL HISTORY: Neuro deficit, acute, stroke suspected. Expressive aphasia.  FINDINGS: BRAIN AND VENTRICLES: No acute infarct. No intracranial hemorrhage. No mass. No midline shift. No hydrocephalus. The sella is unremarkable. Normal flow voids. Moderate periventricular and scattered subcortical white matter T2 signal changes have progressed since 2019. White matter changes extend into the brain stem bilaterally. ORBITS: No acute abnormality. SINUSES AND MASTOIDS: No acute abnormality. BONES AND SOFT TISSUES: Normal marrow signal. No acute soft tissue abnormality. IMPRESSION: 1. No acute intracranial abnormality. 2. Progression of moderate periventricular and scattered subcortical white matter T2 signal changes since 2019, extending into the brain stem bilaterally. Electronically signed by: Lonni Necessary MD 11/19/2023 07:07 AM EDT RP Workstation: [id number]   CT ANGIO HEAD NECK W WO CM W PERF (CODE STROKE) Result Date: 11/19/2023 CLINICAL DATA:  Initial evaluation for acute neuro deficit, stroke. EXAM: CT ANGIOGRAPHY HEAD AND NECK WITH AND WITHOUT CONTRAST TECHNIQUE: Multidetector CT imaging of the head and neck was performed using the standard protocol during bolus administration of intravenous contrast. Multiplanar CT image reconstructions and MIPs were obtained to evaluate the vascular anatomy. Carotid stenosis measurements (when applicable) are obtained utilizing NASCET criteria, using the distal internal carotid diameter as the denominator. RADIATION DOSE REDUCTION: This exam was performed according to the departmental dose-optimization program which includes automated exposure control, adjustment of the mA and/or kV according to patient size and/or use of iterative reconstruction technique. CONTRAST:  OMNIPAQUE  IOHEXOL  350 MG/ML SOLN COMPARISON:  Head CT from earlier the same day. FINDINGS: CTA NECK FINDINGS Aortic arch: Visualized arch within normal limits for caliber with standard branch pattern. No stenosis about the origin the great vessels. Right carotid system:  Right common and internal carotid arteries are tortuous but patent without stenosis or dissection. Left carotid system: Left common and internal carotid arteries are tortuous but patent without stenosis or dissection. Vertebral arteries: Both vertebral arteries arise from subclavian arteries. No proximal subclavian artery stenosis. Vertebral arteries are patent without stenosis or dissection. Skeleton: No discrete or worrisome osseous lesions. Mild-to-moderate multilevel cervical spondylosis. Other neck: No other acute finding. Upper chest: No other acute finding. Review of the MIP  images confirms the above findings CTA HEAD FINDINGS Anterior circulation: Examination somewhat technically limited as the superior aspect of the head has been clipped on the primary images provided. Both internal carotid arteries widely patent through the siphons without stenosis or other abnormality. A1 segments patent bilaterally. Normal anterior communicating artery complex. Visualized anterior cerebral arteries patent without stenosis. No M1 stenosis or occlusion. No proximal MCA branch occlusion. Visualized distal MCA branches perfused and symmetric. Posterior circulation: Both V4 segments patent without stenosis. Both PICA patent. Basilar widely patent without stenosis. Superior cerebellar and posterior cerebral arteries patent bilaterally. Venous sinuses: Visualized major dural sinuses grossly patent allowing for timing the contrast bolus. Anatomic variants: None significant.  No aneurysm. Review of the MIP images confirms the above findings IMPRESSION: 1. Negative CTA of the head and neck. No large vessel occlusion or other emergent finding. No hemodynamically significant or correctable stenosis. 2. Diffuse tortuosity of the major arterial vasculature of the head and neck, suggesting chronic underlying hypertension. Electronically Signed   By: Morene Hoard M.D.   On: 11/19/2023 04:38   DG Chest Portable 1 View Result  Date: 11/19/2023 CLINICAL DATA:  Shortness of breath and swelling in the lower extremities. EXAM: PORTABLE CHEST 1 VIEW COMPARISON:  AP Lat chest 02/17/2022 FINDINGS: The heart is slightly enlarged. No vascular congestion is seen. The mediastinal configuration is normal. There is calcification in the transverse aorta. The lungs are clear. No pleural effusion is evident. Moderate thoracic spondylosis. Overlying telemetry leads. IMPRESSION: No evidence of acute chest disease. Slight cardiomegaly. Aortic atherosclerosis. Stable chest. Electronically Signed   By: Francis Quam M.D.   On: 11/19/2023 03:19   CT HEAD CODE STROKE WO CONTRAST Result Date: 11/19/2023 CLINICAL DATA:  Code stroke. Initial evaluation for acute neuro deficit, stroke suspected. EXAM: CT HEAD WITHOUT CONTRAST TECHNIQUE: Contiguous axial images were obtained from the base of the skull through the vertex without intravenous contrast. RADIATION DOSE REDUCTION: This exam was performed according to the departmental dose-optimization program which includes automated exposure control, adjustment of the mA and/or kV according to patient size and/or use of iterative reconstruction technique. COMPARISON:  CT from 02/18/2022. FINDINGS: Brain: Age-related cerebral atrophy with moderate chronic microvascular ischemic disease. No acute intracranial hemorrhage. No acute large vessel territory infarct. No mass lesion or midline shift. No hydrocephalus or extra-axial fluid collection. Vascular: No abnormal hyperdense vessel. Scattered vascular calcifications noted within the carotid siphons. Skull: Scalp soft tissues within normal limits.  Calvarium intact. Sinuses/Orbits: Globes orbital soft tissues within normal limits. Paranasal sinuses are largely clear. No mastoid effusion. Other: None. ASPECTS [facility] Stroke Program Early CT Score) - Ganglionic level infarction (caudate, lentiform nuclei, internal capsule, insula, M1-M3 cortex): 7 - Supraganglionic  infarction (M4-M6 cortex): 3 Total score (0-10 with 10 being normal): 10 IMPRESSION: 1. No acute intracranial abnormality. 2. Aspects is 10. 3. Age-related cerebral atrophy with moderate chronic microvascular ischemic disease. Results were called by telephone at the time of interpretation on 11/19/2023 at 2:07 am to provider Nurse Powell of the tele stroke service. Electronically Signed   By: Morene Hoard M.D.   On: 11/19/2023 02:08     Labs:   Basic Metabolic Panel: Recent Labs  Lab 11/19/23 0145 11/19/23 0156  NA 141 145  K 3.3* 3.4*  CL 110 110  CO2 23  --   GLUCOSE 103* 102*  BUN 17 16  CREATININE 1.22* 1.30*  CALCIUM 8.9  --   MG 2.0  --   PHOS 2.7  --  GFR CrCl cannot be calculated (Unknown ideal weight.). Liver Function Tests: Recent Labs  Lab 11/19/23 0145  AST 24  ALT 25  ALKPHOS 79  BILITOT 0.5  PROT 7.2  ALBUMIN 3.6   No results for input(s): LIPASE, AMYLASE in the last 168 hours. No results for input(s): AMMONIA in the last 168 hours. Coagulation profile Recent Labs  Lab 11/19/23 0145  INR 1.0    CBC: Recent Labs  Lab 11/19/23 0145 11/19/23 0156  WBC 6.1  --   NEUTROABS 3.1  --   HGB 12.4 12.6  HCT 40.2 37.0  MCV 89.3  --   PLT 183  --    Cardiac Enzymes: No results for input(s): CKTOTAL, CKMB, CKMBINDEX, TROPONINI in the last 168 hours. BNP: Invalid input(s): POCBNP CBG: No results for input(s): GLUCAP in the last 168 hours. D-Dimer No results for input(s): DDIMER in the last 72 hours. Hgb A1c No results for input(s): HGBA1C in the last 72 hours. Lipid Profile No results for input(s): CHOL, HDL, LDLCALC, TRIG, CHOLHDL, LDLDIRECT in the last 72 hours. Thyroid  function studies No results for input(s): TSH, T4TOTAL, T3FREE, THYROIDAB in the last 72 hours.  Invalid input(s): FREET3 Anemia work up No results for input(s): VITAMINB12, FOLATE, FERRITIN, TIBC, IRON,  RETICCTPCT in the last 72 hours. Microbiology No results found for this or any previous visit (from the past 240 hours).  Time coordinating discharge: 45 minutes  Signed: Jacorey Donaway  [facility] 11/19/2023, 10:29 AM

## 2023-11-19 NOTE — ED Notes (Signed)
 [facility] called and will be taking patient back home

## 2023-11-19 NOTE — Progress Notes (Addendum)
 9857 - Code Stroke Activated, Lamar Schlossman PA at bedside  [facility] 11/18/2023 2000, mRS 2. Patient presents via EMS initially with SOB but is found to be aphasic. Patient able to communicate this began just after 2000 last night.   28 - Tele-Neurologist paged  [phone] - Patient to CT   0202 - TS line called to assess patient symptoms  0205 - NCCT results given on camera to [facility]  0215 - Patient returned from CT   0215 - Dr. Alwin joined cart, part of delay due to wrong stroke cart number. NCCT results given on camera at this time  0310 - Patient to CT for advanced imaging   [phone] - CTA read sent to Dr. Alwin, CTP failed to crossover in imaging

## 2023-11-19 NOTE — ED Notes (Signed)
 Patient transported to MRI

## 2023-11-19 NOTE — Consult Note (Addendum)
 Advanced Imaging:  CTA Head and Neck Completed.  CTP Completed.  LVO:No  [facility] Consult Services   Patient Name:   Deborah Reid, Deborah Reid Date of Birth:   09-Jul-1950 Identification Number:   MRN - [id number] Date of Service:   11/19/2023 01:49:44  Diagnosis:       G93.49 - Encephalopathy Multifactorial       I63.89 - Cerebrovascular accident (CVA) due to other mechanism [facility])  Impression:      51F presented via EMS for SOB and leg swelling found to have speech changes upon ED MD evaluation in the ED. Exam appears functional so concern for conversion disorder. Sz and CVA thought to be less likley. Please do a medication reconciliation and if not already on antithrombotics then rec ASA 81 mg for now and admit for MRI brain wo and if neg and symptoms persist then rec psych consult.  Our recommendations are outlined below.  Recommendations:        Stroke/Telemetry Floor       Neuro Checks (Q4)       Bedside Swallow Eval       DVT Prophylaxis       IV Fluids, Normal Saline       Head of Bed 30 Degrees       Euglycemia and Avoid Hyperthermia (PRN Acetaminophen )       Initiate or continue Aspirin  81 MG daily       Antihypertensives PRN if Blood pressure is greater than 220/120 or there is a concern for End organ damage/contraindications for permissive HTN. If blood pressure is greater than 220/120 give labetalol  PO or IV or Vasotec IV with a goal of 15% reduction in BP during the first 24 hours.  Sign Out:       Discussed with Emergency Department Provider    ------------------------------------------------------------------------------  Advanced Imaging: Advanced imaging has been ordered. Results pending.   Metrics: Last Known Well: Unknown Dispatch Time: 11/19/2023 01:49:44 Arrival Time: 11/19/2023 00:58:00 Initial Response Time: 11/19/2023 02:02:07 Symptoms: speech changes. Initial patient interaction: 11/19/2023 02:21:10 NIHSS  Assessment Completed: 11/19/2023 02:31:00 Patient is not a candidate for Thrombolytic. Thrombolytic Medical Decision: 11/19/2023 02:31:00 Patient was not deemed candidate for Thrombolytic because of following reasons: LKW outside 4.5 hr window. SABRA other diagnosis suspected concern for conversion disorder.  CT Head: I personally reviewed all the CT images that were available to me and it showed: no acute pathology  Primary Provider Notified of Diagnostic Impression and Management Plan on: 11/19/2023 02:41:55    ------------------------------------------------------------------------------  History of Present Illness: Patient is a 73 year old Female.  Patient was brought by EMS for symptoms of speech changes. 51F presented via EMS for SOB and leg swelling found to have speech changes upon ED MD evaluation in the ED. When ER MD came in to assess her she was unable to get out words but could follow commands. Daughter was at the home and called EMS. In ED, not on any O2 ad sating well.  LKN: 8 pm and was able to talk at that time but unable to corroborate that. Unable to reach daughter.  Patient is unable to give most of her own history so history taken from review of the medical record and from EMS and/or family.  Uses walker.   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  Past Surgical History: There Is No Surgical History Contributory To Today's Visit    Examination: BP(173/70), Pulse(68), Blood Glucose(103) 1A: Level of  Consciousness - Alert; keenly responsive + 0 1B: Ask Month and Age - Both Questions Right + 0 1C: Blink Eyes & Squeeze Hands - Performs Both Tasks + 0 2: Test Horizontal Extraocular Movements - Normal + 0 3: Test Visual Fields - No Visual Loss + 0 4: Test Facial Palsy (Use Grimace if Obtunded) - Normal symmetry + 0 5A: Test Left Arm Motor Drift - No Drift for 10 Seconds + 0 5B: Test Right Arm Motor Drift - No Drift for 10 Seconds + 0 6A: Test Left Leg Motor Drift - Some Effort Against Gravity + 2 6B: Test Right Leg Motor Drift - Some Effort Against Gravity + 2 7: Test Limb Ataxia (FNF/Heel-Shin) - No Ataxia + 0 8: Test Sensation - Normal; No sensory loss + 0 9: Test Language/Aphasia - Mild-Moderate Aphasia: Some Obvious Changes, Without Significant Limitation + 1 10: Test Dysarthria - Mute/Anarthric + 2 11: Test Extinction/Inattention - No abnormality + 0  NIHSS Score: 7  NIHSS Free Text : effortful exam with grunting and face expressions, poor effort, functional appearing exam  Pre-Morbid Modified Rankin Scale: 3 Points = Moderate disability; requiring some help, but able to walk without assistance  Spoke with : Dr. Carita  This consult was conducted in real time using interactive audio and Immunologist. Patient was informed of the technology being used for this visit and agreed to proceed. Patient located in hospital and provider located at home/office setting.   Patient is being evaluated for possible acute neurologic impairment and high probability of imminent or life-threatening deterioration. I spent total of 39 minutes providing care to this patient, including time for face to face visit via telemedicine, review of medical records, imaging studies and discussion of findings with providers, the patient and/or family.   Dr Idelia Haws   [facility] For Inpatient follow-up with [facility] physician please call [facility] at [phone]. As we are not an  outpatient service for any post hospital discharge needs please contact the hospital for assistance. If you have any questions for the [facility] physicians or need to reconsult for clinical or diagnostic changes please contact us  via [facility] at [phone].

## 2023-11-29 ENCOUNTER — Other Ambulatory Visit (HOSPITAL_COMMUNITY): Payer: Self-pay
